# Patient Record
Sex: Female | Born: 1975 | Race: White | Hispanic: No | Marital: Married | Smoking: Former smoker
Health system: Southern US, Community
[De-identification: ages and names within clinical notes are randomized; demographics above are authoritative.]

## PROBLEM LIST (undated history)

## (undated) DIAGNOSIS — K5900 Constipation, unspecified: Secondary | ICD-10-CM

## (undated) DIAGNOSIS — N63 Unspecified lump in unspecified breast: Secondary | ICD-10-CM

## (undated) HISTORY — DX: Constipation, unspecified: K59.00

## (undated) HISTORY — PX: TONSILLECTOMY: SUR1361

## (undated) HISTORY — DX: Unspecified lump in unspecified breast: N63.0

---

## 2004-11-14 ENCOUNTER — Emergency Department: Payer: Self-pay | Admitting: Unknown Physician Specialty

## 2006-12-11 ENCOUNTER — Emergency Department: Payer: Self-pay | Admitting: Emergency Medicine

## 2006-12-11 ENCOUNTER — Other Ambulatory Visit: Payer: Self-pay

## 2009-05-29 ENCOUNTER — Emergency Department: Payer: Self-pay | Admitting: Emergency Medicine

## 2009-10-13 DIAGNOSIS — K5900 Constipation, unspecified: Secondary | ICD-10-CM

## 2009-10-13 HISTORY — DX: Constipation, unspecified: K59.00

## 2009-12-10 ENCOUNTER — Emergency Department: Payer: Self-pay | Admitting: Emergency Medicine

## 2010-01-13 ENCOUNTER — Emergency Department: Payer: Self-pay | Admitting: Emergency Medicine

## 2010-02-19 ENCOUNTER — Emergency Department: Payer: Self-pay | Admitting: Emergency Medicine

## 2010-04-10 ENCOUNTER — Inpatient Hospital Stay (HOSPITAL_COMMUNITY): Admission: AD | Admit: 2010-04-10 | Discharge: 2010-04-10 | Payer: Self-pay | Admitting: Obstetrics and Gynecology

## 2010-04-10 ENCOUNTER — Ambulatory Visit: Payer: Self-pay | Admitting: Nurse Practitioner

## 2010-06-08 ENCOUNTER — Inpatient Hospital Stay: Payer: Self-pay | Admitting: Obstetrics and Gynecology

## 2010-06-14 LAB — PATHOLOGY REPORT

## 2012-09-15 ENCOUNTER — Ambulatory Visit: Payer: Self-pay

## 2013-04-07 ENCOUNTER — Other Ambulatory Visit: Payer: Self-pay

## 2013-04-07 ENCOUNTER — Encounter: Payer: Self-pay | Admitting: General Surgery

## 2013-04-07 ENCOUNTER — Ambulatory Visit (INDEPENDENT_AMBULATORY_CARE_PROVIDER_SITE_OTHER): Payer: PRIVATE HEALTH INSURANCE | Admitting: General Surgery

## 2013-04-07 VITALS — BP 100/62 | HR 74 | Resp 12 | Ht 64.0 in | Wt 127.0 lb

## 2013-04-07 DIAGNOSIS — N63 Unspecified lump in unspecified breast: Secondary | ICD-10-CM | POA: Insufficient documentation

## 2013-04-07 NOTE — Progress Notes (Signed)
Patient ID: Crystal Cohen, female   DOB: 1976-03-19, 37 y.o.   MRN: [id number]  Chief Complaint  Patient presents with  . Breast Problem    HPI Crystal Cohen is a 37 y.o. female.  Patient here today for follow up breast exam.  She does perform self breast exams. She was here Oct 2013 for similar issues including left breast pain and nipple discharge.  It has been occurring on/off since last visit.  States it may occur a couple times a week. Last mammogram Oct 2013. Paternal grandmother with breast cancer in her 42's. The patient reports she has had intermittent nipple breast discharge since her last pregnancy 2 years ago. This usually only occurs with manipulation. If she does not express the fluid a regular basis she reports that the area becomes more tender and blood and pus is expressed with relief of her discomfort. She denies any nipple oral contact. No history of trauma to the area. HPI  Past Medical History  Diagnosis Date  . Constipation 2011  . Lump or mass in breast     left    Past Surgical History  Procedure Laterality Date  . Tonsillectomy  as child  . Cesarean section  2011    Family History  Problem Relation Age of Onset  . Breast cancer Paternal Grandmother     double mastectomy    Social History History  Substance Use Topics  . Smoking status: Current Every Day Smoker -- 0.50 packs/day for 15 years    Types: Cigarettes  . Smokeless tobacco: Never Used  . Alcohol Use: No    No Known Allergies  No current outpatient prescriptions on file.   No current facility-administered medications for this visit.    Review of Systems Review of Systems  Constitutional: Negative.   Respiratory: Negative.   Cardiovascular: Negative.     Blood pressure 100/62, pulse 74, resp. rate 12, height 5\' 4"  (1.626 m), weight 127 lb (57.607 kg), last menstrual period 03/31/2013.  Physical Exam Physical Exam  Constitutional: She is oriented to person, place, and time. She  appears well-developed and well-nourished.  Cardiovascular: Normal rate and regular rhythm.   Pulmonary/Chest: Effort normal and breath sounds normal. Right breast exhibits no inverted nipple, no mass, no nipple discharge, no skin change and no tenderness. Left breast exhibits nipple discharge. Left breast exhibits no inverted nipple, no mass, no skin change and no tenderness. Breasts are symmetrical.  Right breast slight thickening in the axillary tail.    1 drop of drainage from the left nipple clear. Slight thickening at 3 o'clock and upper outer quadrant.   Lymphadenopathy:    She has no cervical adenopathy.    She has no axillary adenopathy.  Neurological: She is alert and oriented to person, place, and time.  Skin: Skin is warm and dry.   Left breast fuller than right breast, unchanged from past exams. Data Reviewed Ultrasound examination of the retroareolar area showed minimal ductal dilatation and only one small cystic area at the 3:00 position of the left breast corresponding the area of focal thickening. This was 6 cm in the nipple and measured 0.35 x 0.74 x 0.81. Acoustic enhancement was identified.  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     Plan    Without the identification of significant ductal dilatation on ultrasound or any significant drainage on vigorous manipulation today, I am reluctant to recommend duct excision. Smoking cessation is her best course to minimize the  chronic inflammatory state. If after 6 months without smoking she is experiencing continued discomfort and drainage, formal excision of the area may be appropriate.        Earline Mayotte 04/07/2013, 9:29 PM

## 2013-04-07 NOTE — Patient Instructions (Addendum)
Patient advised to stop smoking. Patient to return as needed.

## 2014-08-14 ENCOUNTER — Encounter: Payer: Self-pay | Admitting: General Surgery

## 2015-04-11 ENCOUNTER — Other Ambulatory Visit: Payer: Self-pay | Admitting: Obstetrics & Gynecology

## 2015-04-11 ENCOUNTER — Encounter (HOSPITAL_COMMUNITY): Payer: Self-pay | Admitting: *Deleted

## 2015-04-24 NOTE — H&P (Signed)
Crystal Cohen is an 39 y.o. female here for excision of abdominal wall/ c-section scar nodule that is suspected endometrioma or granuloma.  Pt c/o pain at this site with menses since 1 yr after C/section (39 yo kid) and would swell and hurt with menses but now more constant swelling and pain since few months, she was seen for consult and transfer of care at our office and desires to proceed with surgical excision   No LMP recorded.    Past Medical History  Diagnosis Date  . Constipation 2011  . Lump or mass in breast     left    Past Surgical History  Procedure Laterality Date  . Tonsillectomy  as child  . Cesarean section  2011    Family History  Problem Relation Age of Onset  . Breast cancer Paternal Grandmother     double mastectomy    Social History:  reports that she has been smoking Cigarettes.  She has a 7.5 pack-year smoking history. She has never used smokeless tobacco. She reports that she does not drink alcohol or use illicit drugs.  Allergies: No Known Allergies  No prescriptions prior to admission    ROS neg  Physical Exam BP 117/68 mmHg  Pulse 75  Temp(Src) 98.1 F (36.7 C) (Oral)  Resp 18  Ht 5\' 5"  (1.651 m)  Wt 141 lb (63.957 kg)  BMI 23.46 kg/m2  SpO2 100%  A&O x 3, no acute distress. Pleasant HEENT neg, no thyromegaly Lungs CTA bilat CV RRR, S1S2 normal Abdo soft, non tender, non acute. Nodular mass at right end of C/section scar, possible above fascia but will assess at surgery Extr no edema/ tenderness Pelvic normal exam  No results found for this or any previous visit (from the past 24 hour(s)).  No results found.  Assessment/Plan: 39 yo female with C-section scar nodule and pain, here for excision. Reviewed procedure, possible risk of recurrence and need to undergo more surgery, infection/ pain etc.  Risks/complications of surgery reviewed incl infection, bleeding, damage to internal organs including bladder, bowels, ureters, blood  vessels, other risks from anesthesia, VTE and delayed complications of any surgery, complications in future surgery reviewed.   Walt Geathers R 04/24/2015, 5:42 PM

## 2015-04-25 ENCOUNTER — Encounter (HOSPITAL_COMMUNITY): Payer: Self-pay | Admitting: *Deleted

## 2015-04-25 ENCOUNTER — Ambulatory Visit (HOSPITAL_COMMUNITY): Payer: 59 | Admitting: Anesthesiology

## 2015-04-25 ENCOUNTER — Encounter (HOSPITAL_COMMUNITY)
Admission: RE | Disposition: A | Discharge status: Home / Self Care | Payer: Self-pay | Source: Ambulatory Visit | Attending: Obstetrics & Gynecology

## 2015-04-25 ENCOUNTER — Ambulatory Visit (HOSPITAL_COMMUNITY)
Admission: RE | Admit: 2015-04-25 | Discharge: 2015-04-25 | Disposition: A | Discharge status: Home / Self Care | Payer: 59 | Source: Ambulatory Visit | Attending: Obstetrics & Gynecology | Admitting: Obstetrics & Gynecology

## 2015-04-25 DIAGNOSIS — N809 Endometriosis, unspecified: Secondary | ICD-10-CM | POA: Diagnosis not present

## 2015-04-25 DIAGNOSIS — F17210 Nicotine dependence, cigarettes, uncomplicated: Secondary | ICD-10-CM | POA: Diagnosis not present

## 2015-04-25 DIAGNOSIS — L905 Scar conditions and fibrosis of skin: Secondary | ICD-10-CM | POA: Diagnosis present

## 2015-04-25 DIAGNOSIS — N806 Endometriosis in cutaneous scar: Secondary | ICD-10-CM | POA: Diagnosis present

## 2015-04-25 HISTORY — PX: SCAR REVISION: SHX5285

## 2015-04-25 LAB — CBC
HEMATOCRIT: 40.9 % (ref 36.0–46.0)
Hemoglobin: 13.4 g/dL (ref 12.0–15.0)
MCH: 28.5 pg (ref 26.0–34.0)
MCHC: 32.8 g/dL (ref 30.0–36.0)
MCV: 87 fL (ref 78.0–100.0)
Platelets: 221 10*3/uL (ref 150–400)
RBC: 4.7 MIL/uL (ref 3.87–5.11)
RDW: 13.6 % (ref 11.5–15.5)
WBC: 7.6 10*3/uL (ref 4.0–10.5)

## 2015-04-25 LAB — PREGNANCY, URINE: Preg Test, Ur: NEGATIVE

## 2015-04-25 SURGERY — REVISION, SCAR
Anesthesia: Spinal | Site: Abdomen

## 2015-04-25 MED ORDER — CEFAZOLIN SODIUM-DEXTROSE 2-3 GM-% IV SOLR
INTRAVENOUS | Status: AC
  Filled 2015-04-25: qty 50

## 2015-04-25 MED ORDER — SCOPOLAMINE 1 MG/3DAYS TD PT72
MEDICATED_PATCH | TRANSDERMAL | Status: AC
  Administered 2015-04-25: 1.5 mg via TRANSDERMAL
  Filled 2015-04-25: qty 1

## 2015-04-25 MED ORDER — FENTANYL CITRATE (PF) 100 MCG/2ML IJ SOLN
INTRAMUSCULAR | Status: AC
  Filled 2015-04-25: qty 2

## 2015-04-25 MED ORDER — MIDAZOLAM HCL 5 MG/5ML IJ SOLN
INTRAMUSCULAR | Status: DC | PRN
  Administered 2015-04-25: 2 mg via INTRAVENOUS

## 2015-04-25 MED ORDER — CEFAZOLIN SODIUM-DEXTROSE 2-3 GM-% IV SOLR
2.0000 g | INTRAVENOUS | Status: AC
  Administered 2015-04-25: 2 g via INTRAVENOUS

## 2015-04-25 MED ORDER — PROPOFOL 500 MG/50ML IV EMUL
INTRAVENOUS | Status: AC
  Filled 2015-04-25: qty 50

## 2015-04-25 MED ORDER — DEXAMETHASONE SODIUM PHOSPHATE 4 MG/ML IJ SOLN
INTRAMUSCULAR | Status: AC
  Filled 2015-04-25: qty 1

## 2015-04-25 MED ORDER — MEPERIDINE HCL 25 MG/ML IJ SOLN: 6.2500 mg | INTRAMUSCULAR | Status: DC | PRN

## 2015-04-25 MED ORDER — BUPIVACAINE IN DEXTROSE 0.75-8.25 % IT SOLN
INTRATHECAL | Status: DC | PRN
  Administered 2015-04-25: 1.5 mL via INTRATHECAL

## 2015-04-25 MED ORDER — KETOROLAC TROMETHAMINE 30 MG/ML IJ SOLN
INTRAMUSCULAR | Status: DC | PRN
  Administered 2015-04-25: 30 mg via INTRAVENOUS

## 2015-04-25 MED ORDER — ONDANSETRON HCL 4 MG/2ML IJ SOLN
INTRAMUSCULAR | Status: DC | PRN
  Administered 2015-04-25: 4 mg via INTRAVENOUS

## 2015-04-25 MED ORDER — LACTATED RINGERS IV SOLN
INTRAVENOUS | Status: DC
  Administered 2015-04-25: 09:00:00 via INTRAVENOUS

## 2015-04-25 MED ORDER — FENTANYL CITRATE (PF) 100 MCG/2ML IJ SOLN
INTRAMUSCULAR | Status: DC | PRN
  Administered 2015-04-25 (×2): 50 ug via INTRAVENOUS

## 2015-04-25 MED ORDER — PROPOFOL INFUSION 10 MG/ML OPTIME
INTRAVENOUS | Status: DC | PRN
  Administered 2015-04-25: 50 ug/kg/min via INTRAVENOUS

## 2015-04-25 MED ORDER — METOCLOPRAMIDE HCL 5 MG/ML IJ SOLN: 10.0000 mg | Freq: Once | INTRAMUSCULAR | Status: DC | PRN

## 2015-04-25 MED ORDER — KETOROLAC TROMETHAMINE 30 MG/ML IJ SOLN
INTRAMUSCULAR | Status: AC
  Filled 2015-04-25: qty 1

## 2015-04-25 MED ORDER — LIDOCAINE HCL 2 % IJ SOLN
INTRAMUSCULAR | Status: AC
  Filled 2015-04-25: qty 20

## 2015-04-25 MED ORDER — LIDOCAINE HCL 2 % IJ SOLN
INTRAMUSCULAR | Status: DC | PRN
  Administered 2015-04-25: 5 mL

## 2015-04-25 MED ORDER — HYDROCODONE-ACETAMINOPHEN 7.5-325 MG PO TABS: 1.0000 | ORAL_TABLET | Freq: Once | ORAL | Status: DC | PRN

## 2015-04-25 MED ORDER — MIDAZOLAM HCL 2 MG/2ML IJ SOLN
INTRAMUSCULAR | Status: AC
  Filled 2015-04-25: qty 2

## 2015-04-25 MED ORDER — FENTANYL CITRATE (PF) 100 MCG/2ML IJ SOLN: 25.0000 ug | INTRAMUSCULAR | Status: DC | PRN

## 2015-04-25 MED ORDER — IBUPROFEN 600 MG PO TABS: 600.0000 mg | ORAL_TABLET | Freq: Three times a day (TID) | ORAL | Status: DC | PRN

## 2015-04-25 MED ORDER — ONDANSETRON HCL 4 MG/2ML IJ SOLN
INTRAMUSCULAR | Status: AC
  Filled 2015-04-25: qty 2

## 2015-04-25 MED ORDER — OXYCODONE-ACETAMINOPHEN 5-325 MG PO TABS
1.0000 | ORAL_TABLET | ORAL | Status: DC | PRN
Start: 2015-04-25 — End: 2018-06-07

## 2015-04-25 MED ORDER — DEXAMETHASONE SODIUM PHOSPHATE 10 MG/ML IJ SOLN
INTRAMUSCULAR | Status: DC | PRN
  Administered 2015-04-25: 4 mg via INTRAVENOUS

## 2015-04-25 MED ORDER — SCOPOLAMINE 1 MG/3DAYS TD PT72
1.0000 | MEDICATED_PATCH | Freq: Once | TRANSDERMAL | Status: DC
  Administered 2015-04-25: 1.5 mg via TRANSDERMAL

## 2015-04-25 SURGICAL SUPPLY — 30 items
BENZOIN TINCTURE PRP APPL 2/3 (GAUZE/BANDAGES/DRESSINGS) ×4 IMPLANT
BLADE SURG 15 STRL LF C SS BP (BLADE) ×2 IMPLANT
BLADE SURG 15 STRL SS (BLADE) ×2
CATH ROBINSON RED A/P 14FR (CATHETERS) ×4 IMPLANT
CHLORAPREP W/TINT 26ML (MISCELLANEOUS) ×4 IMPLANT
CLEANER TIP ELECTROSURG 2X2 (MISCELLANEOUS) ×4 IMPLANT
CLOSURE WOUND 1/4 X3 (GAUZE/BANDAGES/DRESSINGS) ×1
CLOTH BEACON ORANGE TIMEOUT ST (SAFETY) ×4 IMPLANT
COUNTER NEEDLE 1200 MAGNETIC (NEEDLE) IMPLANT
DRSG OPSITE POSTOP 3X4 (GAUZE/BANDAGES/DRESSINGS) ×4 IMPLANT
ELECT REM PT RETURN 9FT ADLT (ELECTROSURGICAL) ×4
ELECTRODE REM PT RTRN 9FT ADLT (ELECTROSURGICAL) ×2 IMPLANT
GLOVE BIO SURGEON STRL SZ7 (GLOVE) ×4 IMPLANT
GLOVE INDICATOR 7.0 STRL GRN (GLOVE) ×4 IMPLANT
GOWN STRL REUS W/TWL LRG LVL3 (GOWN DISPOSABLE) ×8 IMPLANT
NEEDLE HYPO 22GX1.5 SAFETY (NEEDLE) ×4 IMPLANT
PACK ABDOMINAL MINOR (CUSTOM PROCEDURE TRAY) ×4 IMPLANT
PAD OB MATERNITY 4.3X12.25 (PERSONAL CARE ITEMS) ×4 IMPLANT
PENCIL BUTTON HOLSTER BLD 10FT (ELECTRODE) IMPLANT
STRIP CLOSURE SKIN 1/4X3 (GAUZE/BANDAGES/DRESSINGS) ×3 IMPLANT
SUT PLAIN 2 0 (SUTURE) ×2
SUT PLAIN ABS 2-0 CT1 27XMFL (SUTURE) ×2 IMPLANT
SUT VIC AB 3-0 SH 27 (SUTURE)
SUT VIC AB 3-0 SH 27X BRD (SUTURE) IMPLANT
SUT VIC AB 4-0 KS 27 (SUTURE) ×4 IMPLANT
SYR CONTROL 10ML LL (SYRINGE) ×4 IMPLANT
TOWEL OR 17X24 6PK STRL BLUE (TOWEL DISPOSABLE) ×8 IMPLANT
TUBING SUCTION BULK 100 FT (MISCELLANEOUS) ×4 IMPLANT
WATER STERILE IRR 1000ML POUR (IV SOLUTION) ×4 IMPLANT
YANKAUER SUCT BULB TIP NO VENT (SUCTIONS) ×4 IMPLANT

## 2015-04-25 NOTE — Discharge Instructions (Signed)
No [employer] productions until 5 pm

## 2015-04-25 NOTE — Anesthesia Postprocedure Evaluation (Signed)
  Anesthesia Post-op Note  Patient: Crystal Cohen  Procedure(s) Performed: Procedure(s) with comments: Cesarean Section SCAR Mass/Nodule Excision (N/A) - 1 hr.  Patient Location: PACU  Anesthesia Type:Spinal  Level of Consciousness: awake, alert  and oriented  Airway and Oxygen Therapy: Patient Spontanous Breathing  Post-op Pain: none  Post-op Assessment: Post-op Vital signs reviewed, Patient's Cardiovascular Status Stable, Respiratory Function Stable, Patent Airway, No signs of Nausea or vomiting, Pain level controlled, No headache, No backache and Spinal receding LLE Motor Response: Purposeful movement LLE Sensation: Increased RLE Motor Response: Purposeful movement RLE Sensation: Increased      Post-op Vital Signs: Reviewed and stable  Last Vitals:  Filed Vitals:   04/25/15 1230  BP: 102/53  Pulse: 59  Temp:   Resp: 17    Complications: No apparent anesthesia complications

## 2015-04-25 NOTE — Transfer of Care (Signed)
Immediate Anesthesia Transfer of Care Note  Patient: Crystal Cohen  Procedure(s) Performed: Procedure(s) with comments: Cesarean Section SCAR Mass/Nodule Excision (N/A) - 1 hr.  Patient Location: PACU  Anesthesia Type:Spinal  Level of Consciousness: awake, alert  and oriented  Airway & Oxygen Therapy: Patient Spontanous Breathing and Patient connected to nasal cannula oxygen  Post-op Assessment: Report given to RN and Post -op Vital signs reviewed and stable  Post vital signs: Reviewed and stable  Last Vitals:  Filed Vitals:   04/25/15 0830  BP: 117/68  Pulse: 75  Temp: 36.7 C  Resp: 18    Complications: No apparent anesthesia complications

## 2015-04-25 NOTE — Anesthesia Procedure Notes (Addendum)
Spinal Patient location during procedure: OR Start time: 04/25/2015 10:05 AM Staffing Anesthesiologist: Mal AmabileFOSTER, Deserie Dirks Performed by: anesthesiologist  Preanesthetic Checklist Completed: patient identified, site marked, surgical consent, pre-op evaluation, timeout performed, IV checked, risks and benefits discussed and monitors and equipment checked Spinal Block Patient position: sitting Prep: site prepped and draped and DuraPrep Patient monitoring: heart rate, cardiac monitor, continuous pulse ox and blood pressure Approach: midline Location: L3-4 Injection technique: single-shot Needle Needle type: Sprotte  Needle gauge: 24 G Needle length: 9 cm Needle insertion depth: 5 cm Assessment Sensory level: T6 Additional Notes Patient tolerated procedure well. Adequate sensory level.

## 2015-04-25 NOTE — Anesthesia Preprocedure Evaluation (Signed)
Anesthesia Evaluation  Patient identified by MRN, date of birth, ID band Patient awake    Reviewed: Allergy & Precautions, NPO status , Patient's Chart, lab work & pertinent test results  Airway Mallampati: I  TM Distance: >3 FB Neck ROM: Full    Dental no notable dental hx.    Pulmonary Current Smoker,  breath sounds clear to auscultation  Pulmonary exam normal       Cardiovascular negative cardio ROS Normal cardiovascular examRhythm:Regular Rate:Normal     Neuro/Psych negative neurological ROS  negative psych ROS   GI/Hepatic negative GI ROS, Neg liver ROS,   Endo/Other  negative endocrine ROS  Renal/GU negative Renal ROS  negative genitourinary   Musculoskeletal C/Section scar mass/nodule   Abdominal   Peds  Hematology negative hematology ROS (+)   Anesthesia Other Findings   Reproductive/Obstetrics negative OB ROS                             Anesthesia Physical Anesthesia Plan  ASA: II  Anesthesia Plan: Spinal   Post-op Pain Management:    Induction: Intravenous  Airway Management Planned: Natural Airway  Additional Equipment:   Intra-op Plan:   Post-operative Plan:   Informed Consent: I have reviewed the patients History and Physical, chart, labs and discussed the procedure including the risks, benefits and alternatives for the proposed anesthesia with the patient or authorized representative who has indicated his/her understanding and acceptance.     Plan Discussed with: CRNA, Anesthesiologist and Surgeon  Anesthesia Plan Comments:         Anesthesia Quick Evaluation

## 2015-04-25 NOTE — Op Note (Signed)
PRE-OPERATIVE DIAGNOSIS:  Abdominal wall mass at c-section scar  POST-OPERATIVE DIAGNOSIS:  Same   PROCEDURE:  Exploration of c-section scar and excision of scar nodule   SURGEON: Robley FriesVaishali R Bashar Milam, MD  ASSISTANT:  Crystal Cohen, CNM  ANESTHESIA:  Spinal   EBL:  20 cc  IVF: LR   Urine output: urine in foley  LOCAL MEDICATIONS USED:  MARCAINE 0.25% 10 cc skin infiltration     SPECIMEN:  Abdominal wall scar nodule 3 cm size  DISPOSITION OF SPECIMEN:  PATHOLOGY  COUNTS:  YES  PATIENT DISPOSITION:  PACU - hemodynamically stable   PROCEDURE:   Indication: Abdominal wall mass in c-section scar with pain. Patient notes this mass since over 2 years but is more prominent, painful and desires intervention. Excision reviewed, patient gave informed written consent.   Patient was brought to the Operating Room and time out was done, confirmed patient as Crystal Cohen and procedure as Exploration of scar and excision of scar nodule. 2 gm Ancef given. She underwent Spinal anesthesia which was noted to be excellent. Foley was placed. Abdomen was prepped and draped. Mass was delineated with a marker. Transverse incision made about 4-5 cm from the right end of her c-section scar. Incision was carried down about 3-4 mm where the hard mass was palpated. Skin edges were grasped with Allis clamps and mass with Allis and Towel clamp and placed under tension while sharp dissection was performed to excise it from the surrounding soft fatty tissue. Base was noted above the fascia. Mass was excised entirely and measured 3 cm and sent to pathology. Bleeding was minimal and controlled with cautery. Area was palpated and no hard/fibrous area were palpated in the space from where mass was excised. Subcutaneous layer was closed with 2-0 Plain gut to cover the dead space. Skin approximated with 4-0 Vicryl in subcuticular fashion.  Sterile dressing placed.  Sterile Honeycomb dressing placed.  All  Instruments/ lap/  sponges counts were correct x2. No complications. Mass shown to patient and picture to her husband.   Dr Crystal Cohen was the surgeon for entire case.

## 2015-04-26 ENCOUNTER — Encounter (HOSPITAL_COMMUNITY): Payer: Self-pay | Admitting: Obstetrics & Gynecology

## 2016-04-29 ENCOUNTER — Other Ambulatory Visit: Payer: Self-pay | Admitting: Obstetrics & Gynecology

## 2016-08-11 ENCOUNTER — Encounter (INDEPENDENT_AMBULATORY_CARE_PROVIDER_SITE_OTHER): Payer: Self-pay

## 2016-08-22 ENCOUNTER — Ambulatory Visit: Payer: Self-pay | Admitting: Family Medicine

## 2018-02-05 ENCOUNTER — Encounter: Payer: Self-pay | Admitting: Certified Nurse Midwife

## 2018-02-12 ENCOUNTER — Encounter: Payer: Self-pay | Admitting: Certified Nurse Midwife

## 2018-03-19 ENCOUNTER — Encounter: Payer: Self-pay | Admitting: Certified Nurse Midwife

## 2018-06-07 ENCOUNTER — Encounter: Payer: Self-pay | Admitting: Certified Nurse Midwife

## 2018-06-07 ENCOUNTER — Ambulatory Visit (INDEPENDENT_AMBULATORY_CARE_PROVIDER_SITE_OTHER): Payer: BLUE CROSS/BLUE SHIELD | Admitting: Certified Nurse Midwife

## 2018-06-07 ENCOUNTER — Other Ambulatory Visit (HOSPITAL_COMMUNITY)
Admission: RE | Admit: 2018-06-07 | Discharge: 2018-06-07 | Disposition: A | Discharge status: Home / Self Care | Payer: BLUE CROSS/BLUE SHIELD | Source: Ambulatory Visit | Attending: Certified Nurse Midwife | Admitting: Certified Nurse Midwife

## 2018-06-07 VITALS — BP 118/76 | HR 93 | Ht 64.0 in | Wt 150.3 lb

## 2018-06-07 DIAGNOSIS — Z124 Encounter for screening for malignant neoplasm of cervix: Secondary | ICD-10-CM | POA: Diagnosis present

## 2018-06-07 DIAGNOSIS — Z1231 Encounter for screening mammogram for malignant neoplasm of breast: Secondary | ICD-10-CM

## 2018-06-07 DIAGNOSIS — Z1151 Encounter for screening for human papillomavirus (HPV): Secondary | ICD-10-CM | POA: Insufficient documentation

## 2018-06-07 DIAGNOSIS — Z0000 Encounter for general adult medical examination without abnormal findings: Secondary | ICD-10-CM

## 2018-06-07 DIAGNOSIS — Z1239 Encounter for other screening for malignant neoplasm of breast: Secondary | ICD-10-CM

## 2018-06-07 DIAGNOSIS — Z01419 Encounter for gynecological examination (general) (routine) without abnormal findings: Secondary | ICD-10-CM | POA: Diagnosis not present

## 2018-06-07 MED ORDER — VARENICLINE TARTRATE 0.5 MG PO TABS: 0.5000 mg | ORAL_TABLET | Freq: Two times a day (BID) | ORAL | 3 refills | Status: DC

## 2018-06-07 NOTE — Progress Notes (Signed)
Pt is here for an annual exam. Poss yeast infection. Has been on amoxicillin.

## 2018-06-07 NOTE — Progress Notes (Signed)
GYNECOLOGY ANNUAL PREVENTATIVE CARE ENCOUNTER NOTE  Subjective:   Crystal Cohen is a 42 y.o. 373P0 female here for a routine annual gynecologic exam.  Current complaints: none.   Denies abnormal vaginal bleeding, discharge, pelvic pain, problems with intercourse or other gynecologic concerns.    Gynecologic History Patient's last menstrual period was 05/17/2018 (exact date). Contraception: tubal ligation Last Pap: a few years ago. Results were: normal per pt Last mammogram: a few years ago  Results were: normal  Obstetric History OB History  Gravida Para Term Preterm AB Living  3         4  SAB TAB Ectopic Multiple Live Births        1      # Outcome Date GA Lbr Len/2nd Weight Sex Delivery Anes PTL Lv  3 Gravida           2 Gravida           1 Gravida             Obstetric Comments  Menstrual age: 89    Age 1st Pregnancy:16    Past Medical History:  Diagnosis Date  . Constipation 2011  . Lump or mass in breast    left  . Vaginal delivery 1993, 2002    Past Surgical History:  Procedure Laterality Date  . CESAREAN SECTION  2011  . SCAR REVISION N/A 04/25/2015   Procedure: Cesarean Section SCAR Mass/Nodule Excision;  Surgeon: Shea EvansVaishali Mody, MD;  Location: [facility];  Service: Gynecology;  Laterality: N/A;  1 hr.  . TONSILLECTOMY  as child    Current Outpatient Medications on File Prior to Visit  Medication Sig Dispense Refill  . amoxicillin (AMOXIL) 500 MG capsule TAKE 1 CAPSULE BY MOUTH THREE TIMES A DAY UNTIL GONE  0  . ibuprofen (ADVIL,MOTRIN) 600 MG tablet Take 1 tablet (600 mg total) by mouth every 8 (eight) hours as needed for mild pain or moderate pain. 20 tablet 0   No current facility-administered medications on file prior to visit.     No Known Allergies  Social History:  reports that she has been smoking cigarettes. She has a 7.50 pack-year smoking history. She has never used smokeless tobacco. She reports that she does not drink alcohol or use  drugs.  Family History  Problem Relation Age of Onset  . Breast cancer Paternal Grandmother        double mastectomy    The following portions of the patient's history were reviewed and updated as appropriate: allergies, current medications, past family history, past medical history, past social history, past surgical history and problem list.  Review of Systems Pertinent items noted in HPI and remainder of comprehensive ROS otherwise negative.   Objective:  BP 118/76   Pulse 93   Ht 5\' 4"  (1.626 m)   Wt 150 lb 5 oz (68.2 kg)   LMP 05/17/2018 (Exact Date)   BMI 25.80 kg/m  CONSTITUTIONAL: Well-developed, well-nourished female in no acute distress.  HENT:  Normocephalic, atraumatic, External right and left ear normal. Oropharynx is clear and moist EYES: Conjunctivae and EOM are normal. Pupils are equal, round, and reactive to light. No scleral icterus.  NECK: Normal range of motion, supple, no masses.  Normal thyroid.  SKIN: Skin is warm and dry. No rash noted. Not diaphoretic. No erythema. No pallor. MUSCULOSKELETAL: Normal range of motion. No tenderness.  No cyanosis, clubbing, or edema.  2+ distal pulses. NEUROLOGIC: Alert and oriented to person, place, and time.  Normal reflexes, muscle tone coordination. No cranial nerve deficit noted. PSYCHIATRIC: Normal mood and affect. Normal behavior. Normal judgment and thought content. CARDIOVASCULAR: Normal heart rate noted, regular rhythm RESPIRATORY: Clear to auscultation bilaterally. Effort and breath sounds normal, no problems with respiration noted. BREASTS: Symmetric in size. No masses, skin changes, nipple drainage, or lymphadenopathy. ABDOMEN: Soft, normal bowel sounds, no distention noted.  No tenderness, rebound or guarding.  PELVIC: Normal appearing external genitalia; normal appearing vaginal mucosa and cervix.  No abnormal discharge noted.  Pap smear obtained.  Normal uterine size, no other palpable masses, no uterine or  adnexal tenderness.    Assessment and Plan:  Annual Physical exam. Discussed smoking cessation. She states that she has tried wellbutrin in the past and it gave her terrible night mares. She request to try Chantix.  Will follow up results of pap smear and manage accordingly. Mammogram scheduled Routine preventative health maintenance measures emphasized. Please refer to After Visit Summary for other counseling recommendations.   Doreene Burke, CNM

## 2018-06-07 NOTE — Patient Instructions (Signed)
Preventing High Cholesterol Cholesterol is a waxy, fat-like substance that your body needs in small amounts. Your liver makes all the cholesterol that your body needs. Having high cholesterol (hypercholesterolemia) increases your risk for heart disease and stroke. Extra (excess) cholesterol comes from the food you eat, such as animal-based fat (saturated fat) from meat and some dairy products. High cholesterol can often be prevented with diet and lifestyle changes. If you already have high cholesterol, you can control it with diet and lifestyle changes, as well as medicine. What nutrition changes can be made?  Eat less saturated fat. Foods that contain saturated fat include red meat and some dairy products.  Avoid processed meats, like bacon and lunch meats.  Avoid trans fats, which are found in margarine and some baked goods.  Avoid foods and beverages that have added sugars.  Eat more fruits, vegetables, and whole grains.  Choose healthy sources of protein, such as fish, poultry, and nuts.  Choose healthy sources of fat, such as: ? Nuts. ? Vegetable oils, especially olive oil. ? Fish that have healthy fats (omega-3 fatty acids), such as mackerel or salmon. What lifestyle changes can be made?  Lose weight if you are overweight. Losing 5-10 lb (2.3-4.5 kg) can help prevent or control high cholesterol and reduce your risk for diabetes and high blood pressure. Ask your health care provider to help you with a diet and exercise plan to safely lose weight.  Get enough exercise. Do at least 150 minutes of moderate-intensity exercise each week. ? You could do this in short exercise sessions several times a day, or you could do longer exercise sessions a few times a week. For example, you could take a brisk 10-minute walk or bike ride, 3 times a day, for 5 days a week.  Do not smoke. If you need help quitting, ask your health care provider.  Limit your alcohol intake. If you drink alcohol,  limit alcohol intake to no more than 1 drink a day for nonpregnant women and 2 drinks a day for men. One drink equals 12 oz of beer, 5 oz of wine, or 1 oz of hard liquor. Why are these changes important? If you have high cholesterol, deposits (plaques) may build up on the walls of your blood vessels. Plaques make the arteries narrower and stiffer, which can restrict or block blood flow and cause blood clots to form. This greatly increases your risk for heart attack and stroke. Making diet and lifestyle changes can reduce your risk for these life-threatening conditions. What can I do to lower my risk?  Manage your risk factors for high cholesterol. Talk with your health care provider about all of your risk factors and how to lower your risk.  Manage other conditions that you have, such as diabetes or high blood pressure (hypertension).  Have your cholesterol checked at regular intervals.  Keep all follow-up visits as told by your health care provider. This is important. How is this treated? In addition to diet and lifestyle changes, your health care provider may recommend medicines to help lower cholesterol, such as a medicine to reduce the amount of cholesterol made in your liver. You may need medicine if:  Diet and lifestyle changes do not lower your cholesterol enough.  You have high cholesterol and other risk factors for heart disease or stroke.  Take over-the-counter and prescription medicines only as told by your health care provider. Where to find more information:  American Heart Association: www.heart.org/HEARTORG/Conditions/Cholesterol/Cholesterol_UCM_001089_SubHomePage.jsp  National Heart, Lung, and   Blood Institute: www.nhlbi.nih.gov/health/resources/heart/heart-cholesterol-hbc-what-html Summary  High cholesterol increases your risk for heart disease and stroke. By keeping your cholesterol level low, you can reduce your risk for these conditions.  Diet and lifestyle changes  are the most important steps in preventing high cholesterol.  Work with your health care provider to manage your risk factors, and have your blood tested regularly. This information is not intended to replace advice given to you by your health care provider. Make sure you discuss any questions you have with your health care provider. Document Released: 10/14/2015 Document Revised: 06/07/2016 Document Reviewed: 06/07/2016 Elsevier Interactive Patient Education  2018 [employer]  

## 2018-06-08 ENCOUNTER — Telehealth: Payer: Self-pay

## 2018-06-08 ENCOUNTER — Other Ambulatory Visit: Payer: Self-pay

## 2018-06-08 ENCOUNTER — Telehealth: Payer: Self-pay | Admitting: Certified Nurse Midwife

## 2018-06-08 LAB — LIPID PANEL
CHOLESTEROL TOTAL: 220 mg/dL — AB (ref 100–199)
Chol/HDL Ratio: 5.1 ratio — ABNORMAL HIGH (ref 0.0–4.4)
HDL: 43 mg/dL (ref >39)
LDL Calculated: 151 mg/dL — ABNORMAL HIGH (ref 0–99)
TRIGLYCERIDES: 129 mg/dL (ref 0–149)
VLDL CHOLESTEROL CAL: 26 mg/dL (ref 5–40)

## 2018-06-08 MED ORDER — FLUCONAZOLE 150 MG PO TABS: 150.0000 mg | ORAL_TABLET | Freq: Once | ORAL | 1 refills | Status: AC

## 2018-06-08 NOTE — Telephone Encounter (Signed)
The patient called and stated that one of her medications did not get sent in to her pharmacy. The patient stated that she was informed by Pattricia BossAnnie that "two medications" would be sent in for her Yeast infection. The patient would like a call back to confirm the medication has been sent to her pharmacy. Please advise.

## 2018-06-08 NOTE — Telephone Encounter (Signed)
Diflucan sent to pharmacy with confirmation of receipt.

## 2018-06-09 LAB — CYTOLOGY - PAP
Diagnosis: NEGATIVE
HPV (WINDOPATH): NOT DETECTED

## 2018-11-23 ENCOUNTER — Emergency Department: Payer: BLUE CROSS/BLUE SHIELD

## 2018-11-23 ENCOUNTER — Other Ambulatory Visit: Payer: Self-pay

## 2018-11-23 ENCOUNTER — Emergency Department
Admission: EM | Admit: 2018-11-23 | Discharge: 2018-11-23 | Disposition: A | Discharge status: Home / Self Care | Payer: BLUE CROSS/BLUE SHIELD | Attending: Emergency Medicine | Admitting: Emergency Medicine

## 2018-11-23 ENCOUNTER — Encounter: Payer: Self-pay | Admitting: *Deleted

## 2018-11-23 DIAGNOSIS — M79605 Pain in left leg: Secondary | ICD-10-CM | POA: Diagnosis not present

## 2018-11-23 DIAGNOSIS — R42 Dizziness and giddiness: Secondary | ICD-10-CM | POA: Diagnosis not present

## 2018-11-23 DIAGNOSIS — F17210 Nicotine dependence, cigarettes, uncomplicated: Secondary | ICD-10-CM | POA: Insufficient documentation

## 2018-11-23 DIAGNOSIS — Z79899 Other long term (current) drug therapy: Secondary | ICD-10-CM | POA: Diagnosis not present

## 2018-11-23 DIAGNOSIS — R0789 Other chest pain: Secondary | ICD-10-CM | POA: Diagnosis not present

## 2018-11-23 DIAGNOSIS — R0602 Shortness of breath: Secondary | ICD-10-CM | POA: Diagnosis not present

## 2018-11-23 LAB — BASIC METABOLIC PANEL
ANION GAP: 10 (ref 5–15)
BUN: 10 mg/dL (ref 6–20)
CALCIUM: 9 mg/dL (ref 8.9–10.3)
CO2: 25 mmol/L (ref 22–32)
Chloride: 104 mmol/L (ref 98–111)
Creatinine, Ser: 0.73 mg/dL (ref 0.44–1.00)
GFR calc non Af Amer: >60 mL/min (ref >60)
Glucose, Bld: 95 mg/dL (ref 70–99)
Potassium: 3.5 mmol/L (ref 3.5–5.1)
Sodium: 139 mmol/L (ref 135–145)

## 2018-11-23 LAB — CBC
HCT: 45 % (ref 36.0–46.0)
Hemoglobin: 14.2 g/dL (ref 12.0–15.0)
MCH: 27.5 pg (ref 26.0–34.0)
MCHC: 31.6 g/dL (ref 30.0–36.0)
MCV: 87.2 fL (ref 80.0–100.0)
Platelets: 290 10*3/uL (ref 150–400)
RBC: 5.16 MIL/uL — AB (ref 3.87–5.11)
RDW: 13.5 % (ref 11.5–15.5)
WBC: 10.7 10*3/uL — ABNORMAL HIGH (ref 4.0–10.5)
nRBC: 0 % (ref 0.0–0.2)

## 2018-11-23 LAB — TROPONIN I
Troponin I: <0.03 ng/mL (ref <0.03)
Troponin I: <0.03 ng/mL (ref <0.03)

## 2018-11-23 LAB — FIBRIN DERIVATIVES D-DIMER (ARMC ONLY): FIBRIN DERIVATIVES D-DIMER (ARMC): 522.96 ng{FEU}/mL — AB (ref 0.00–499.00)

## 2018-11-23 LAB — POCT PREGNANCY, URINE: PREG TEST UR: NEGATIVE

## 2018-11-23 MED ORDER — IOHEXOL 350 MG/ML SOLN
75.0000 mL | Freq: Once | INTRAVENOUS | Status: AC | PRN
  Administered 2018-11-23: 75 mL via INTRAVENOUS

## 2018-11-23 NOTE — ED Provider Notes (Signed)
[facility] Emergency Department Provider Note ____________________________________________   First MD Initiated Contact with Patient 11/23/18 2101     (approximate)  I have reviewed the triage vital signs and the nursing notes.   HISTORY  Chief Complaint Chest Pain    HPI Crystal Cohen is a 43 y.o. female with PMH as noted below who presents with chest pain, acute onset around 6 PM today when the patient was sitting and not exerting herself.  It is described as sharp and substernal with with some radiation to the left side.  The patient states that after the pain started she began to have symptoms that she states are similar to prior panic attacks, with shortness of breath and lightheadedness.  The symptoms have now improved.  She does not have any prior history of this chest pain, and has no prior cardiac history.  The patient also reports pain in her left leg radiating from the back, described as shooting, and occurring intermittently over the last 2 days.  She has no swelling to the leg.  The patient states that she is not on OCPs and has no cancer history.  She has had no recent immobilization.  She does smoke.  The patient does report recent increased stress as well.   Past Medical History:  Diagnosis Date  . Constipation 2011  . Lump or mass in breast    left  . Vaginal delivery 1993, 2002    Patient Active Problem List   Diagnosis Date Noted  . 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 Lump or mass in breast 04/07/2013    Past Surgical History:  Procedure Laterality Date  . CESAREAN SECTION  2011  . SCAR REVISION N/A 04/25/2015   Procedure: Cesarean Section SCAR Mass/Nodule Excision;  Surgeon: Shea EvansVaishali Mody, MD;  Location: [facility];  Service: Gynecology;  Laterality: N/A;  1 hr.  . TONSILLECTOMY  as child    Prior to Admission medications   Medication Sig Start Date End Date Taking? Authorizing Provider  amoxicillin (AMOXIL) 500 MG  capsule TAKE 1 CAPSULE BY MOUTH THREE TIMES A DAY UNTIL GONE 06/01/18   [provider]  ibuprofen (ADVIL,MOTRIN) 600 MG tablet Take 1 tablet (600 mg total) by mouth every 8 (eight) hours as needed for mild pain or moderate pain. 04/25/15   Shea EvansMody, Vaishali, MD  varenicline (CHANTIX) 0.5 MG tablet Take 1 tablet (0.5 mg total) by mouth 2 (two) times daily. Days 1 to 3: 0.5 mg once daily.Days 4 to 7: 0.5 mg twice daily 06/07/18   Doreene Burkehompson, Annie, CNM    Allergies Patient has no known allergies.  Family History  Problem Relation Age of Onset  . Breast cancer Paternal Grandmother        double mastectomy    Social History Social History   Tobacco Use  . Smoking status: Current Every Day Smoker    Packs/day: 0.50    Years: 15.00    Pack years: 7.50    Types: Cigarettes  . Smokeless tobacco: Never Used  Substance Use Topics  . Alcohol use: No  . Drug use: No    Review of Systems  Constitutional: No fever. Eyes: No redness. ENT: No neck pain. Cardiovascular: Positive for chest pain. Respiratory: Positive for shortness of breath. Gastrointestinal: No vomiting or diarrhea.  Genitourinary: Negative for flank pain.  Musculoskeletal: Positive for back pain. Skin: Negative for rash. Neurological: Negative for headaches, focal weakness or numbness.   ____________________________________________   PHYSICAL EXAM:  VITAL SIGNS: ED Triage Vitals [11/23/18 1945]  Enc Vitals Group     BP (!) 142/80     Pulse Rate 94     Resp 16     Temp 98.2 F (36.8 C)     Temp Source Oral     SpO2 99 %     Weight 160 lb (72.6 kg)     Height 5\' 4"  (1.626 m)     Head Circumference      Peak Flow      Pain Score 7     Pain Loc      Pain Edu?      Excl. in [place]?     Constitutional: Alert and oriented. Well appearing and in no acute distress. Eyes: Conjunctivae are normal.  Head: Atraumatic. Nose: No congestion/rhinnorhea. Mouth/Throat: Mucous membranes are moist.   Neck: Normal  range of motion.  Cardiovascular: Normal rate, regular rhythm. Grossly normal heart sounds.  Good peripheral circulation. Respiratory: Normal respiratory effort.  No retractions. Lungs CTAB. Gastrointestinal: No distention.  Musculoskeletal: No lower extremity edema.  Extremities warm and well perfused.  No calf or popliteal tenderness or swelling. Neurologic:  Normal speech and language. No gross focal neurologic deficits are appreciated.  Skin:  Skin is warm and dry. No rash noted. Psychiatric: Mood and affect are normal. Speech and behavior are normal.  ____________________________________________   LABS (all labs ordered are listed, but only abnormal results are displayed)  Labs Reviewed  CBC - Abnormal; Notable for the following components:      Result Value   WBC 10.7 (*)    RBC 5.16 (*)    All other components within normal limits  FIBRIN DERIVATIVES D-DIMER ([facility] ONLY) - Abnormal; Notable for the following components:   Fibrin derivatives D-dimer ([facility]) 522.96 (*)    All other components within normal limits  BASIC METABOLIC PANEL  TROPONIN I  TROPONIN I  POC URINE PREG, ED  POCT PREGNANCY, URINE   ____________________________________________  EKG  ED ECG REPORT I, Dionne BucySebastian Ananth Fiallos, the attending physician, personally viewed and interpreted this ECG.  Date: 11/23/2018 EKG Time: 1946 Rate: 91 Rhythm: normal sinus rhythm QRS Axis: normal Intervals: normal ST/T Wave abnormalities: normal Narrative Interpretation: no evidence of acute ischemia  ____________________________________________  RADIOLOGY  CXR: No focal infiltrate or other acute abnormality  ____________________________________________   PROCEDURES  Procedure(s) performed: No  Procedures  Critical Care performed: No ____________________________________________   INITIAL IMPRESSION / ASSESSMENT AND PLAN / ED COURSE  Pertinent labs & imaging results that were available during my care of  the patient were reviewed by me and considered in my medical decision making (see chart for details).  43 year old female with PMH as noted above presents with atypical and nonexertional chest pain, with symptoms of acute anxiety/panic following the initial pain.  The patient also reports some intermittent shooting pains in her left leg over the last few days, especially at night.  I reviewed the past medical records in Epic.  The patient has had no recent prior ED visits or admissions.  She has no prior cardiac history.  On exam the patient is overall well-appearing and her vital signs are normal.  The physical exam is unremarkable.  She has no lower leg swelling or any tenderness.  Her EKG is also normal.  Overall presentation is consistent with benign etiology such as anxiety/panic, musculoskeletal pain, or GERD.  The patient has no risk factors for ACS other than smoking.  I also have a very  low suspicion for PE as her lower extremity symptoms are not really consistent with a DVT and other than smoking she has no VTE risk factors.  There is no evidence of aortic dissection or other vascular cause.  Initial lab work-up and first troponin are negative, as is the chest x-ray.  We will obtain a repeat troponin and d-dimer to rule out PE.  If these are negative, anticipate discharge home.  ----------------------------------------- 11:32 PM on 11/23/2018 -----------------------------------------  D-dimer was slightly elevated, so we obtained a CT chest which was negative.  Repeat troponin was negative.  At this time the patient is more comfortable and feels well to go home.  She is stable for discharge at this time.  Return precautions given, and she expresses understanding.  ____________________________________________   FINAL CLINICAL IMPRESSION(S) / ED DIAGNOSES  Final diagnoses:  Atypical chest pain      NEW MEDICATIONS STARTED DURING THIS VISIT:  New Prescriptions   No  medications on file     Note:  This document was prepared using Dragon voice recognition software and may include unintentional dictation errors.    Dionne Bucy, MD 11/23/18 2332

## 2018-11-23 NOTE — ED Triage Notes (Signed)
Pt to ED reporting centralized chest pains. Pain radiating to the left ribs. SOB, and lightheadedness reported. Pain is sharp in nature and started suddenly 1-2 hours ago. No cardiac hx. PT verbalized the feeling is similar to a panic attack but also feels different than past panic attacks.   Pt also reporting left  back and leg pains x 2 weeks. PT appears to have pain when ambulating.

## 2018-11-23 NOTE — Discharge Instructions (Signed)
Return to the ER for new, worsening, or persistent severe chest pain, difficulty breathing, weakness or lightheadedness, or any other new or worsening symptoms that concern you.  Follow-up with a primary care doctor.

## 2018-12-01 ENCOUNTER — Other Ambulatory Visit: Payer: Self-pay | Admitting: Certified Nurse Midwife

## 2019-06-06 ENCOUNTER — Encounter: Payer: Self-pay | Admitting: Emergency Medicine

## 2019-06-06 ENCOUNTER — Emergency Department: Payer: BC Managed Care – PPO

## 2019-06-06 ENCOUNTER — Emergency Department
Admission: EM | Admit: 2019-06-06 | Discharge: 2019-06-06 | Disposition: A | Discharge status: Home / Self Care | Payer: BC Managed Care – PPO | Attending: Emergency Medicine | Admitting: Emergency Medicine

## 2019-06-06 ENCOUNTER — Other Ambulatory Visit: Payer: Self-pay

## 2019-06-06 DIAGNOSIS — F17210 Nicotine dependence, cigarettes, uncomplicated: Secondary | ICD-10-CM | POA: Diagnosis not present

## 2019-06-06 DIAGNOSIS — R531 Weakness: Secondary | ICD-10-CM | POA: Insufficient documentation

## 2019-06-06 DIAGNOSIS — R42 Dizziness and giddiness: Secondary | ICD-10-CM | POA: Diagnosis present

## 2019-06-06 LAB — URINALYSIS, COMPLETE (UACMP) WITH MICROSCOPIC
Bacteria, UA: NONE SEEN
Bilirubin Urine: NEGATIVE
Glucose, UA: NEGATIVE mg/dL
Hgb urine dipstick: NEGATIVE
Ketones, ur: NEGATIVE mg/dL
Leukocytes,Ua: NEGATIVE
Nitrite: NEGATIVE
Protein, ur: NEGATIVE mg/dL
Specific Gravity, Urine: 1.006 (ref 1.005–1.030)
pH: 7 (ref 5.0–8.0)

## 2019-06-06 LAB — BASIC METABOLIC PANEL
Anion gap: 9 (ref 5–15)
BUN: 14 mg/dL (ref 6–20)
CO2: 25 mmol/L (ref 22–32)
Calcium: 9.2 mg/dL (ref 8.9–10.3)
Chloride: 108 mmol/L (ref 98–111)
Creatinine, Ser: 0.79 mg/dL (ref 0.44–1.00)
GFR calc Af Amer: >60 mL/min (ref >60)
GFR calc non Af Amer: >60 mL/min (ref >60)
Glucose, Bld: 106 mg/dL — ABNORMAL HIGH (ref 70–99)
Potassium: 4 mmol/L (ref 3.5–5.1)
Sodium: 142 mmol/L (ref 135–145)

## 2019-06-06 LAB — CBC
HCT: 40.8 % (ref 36.0–46.0)
Hemoglobin: 13.2 g/dL (ref 12.0–15.0)
MCH: 27.4 pg (ref 26.0–34.0)
MCHC: 32.4 g/dL (ref 30.0–36.0)
MCV: 84.8 fL (ref 80.0–100.0)
Platelets: 286 10*3/uL (ref 150–400)
RBC: 4.81 MIL/uL (ref 3.87–5.11)
RDW: 14.1 % (ref 11.5–15.5)
WBC: 7.6 10*3/uL (ref 4.0–10.5)
nRBC: 0 % (ref 0.0–0.2)

## 2019-06-06 LAB — POCT PREGNANCY, URINE: Preg Test, Ur: NEGATIVE

## 2019-06-06 MED ORDER — LORAZEPAM 0.5 MG PO TABS: 0.5000 mg | ORAL_TABLET | Freq: Two times a day (BID) | ORAL | 0 refills | Status: AC | PRN

## 2019-06-06 MED ORDER — SODIUM CHLORIDE 0.9 % IV BOLUS
1000.0000 mL | Freq: Once | INTRAVENOUS | Status: AC
  Administered 2019-06-06: 11:00:00 1000 mL via INTRAVENOUS

## 2019-06-06 MED ORDER — LORAZEPAM 2 MG/ML IJ SOLN
1.0000 mg | Freq: Once | INTRAMUSCULAR | Status: AC
  Administered 2019-06-06: 11:00:00 1 mg via INTRAVENOUS
  Filled 2019-06-06: qty 1

## 2019-06-06 MED ORDER — SODIUM CHLORIDE 0.9% FLUSH
3.0000 mL | Freq: Once | INTRAVENOUS | Status: AC
  Administered 2019-06-06: 10:00:00 3 mL via INTRAVENOUS

## 2019-06-06 NOTE — ED Provider Notes (Signed)
[facility] Emergency Department Provider Note  Time seen: 10:31 AM  I have reviewed the triage vital signs and the nursing notes.   HISTORY  Chief Complaint Dizziness   HPI Crystal Cohen is a 43 y.o. female with no past medical history presents to the emergency department for dizziness/lightheadedness.  According to the patient over the past 6 to 8 months she has been experiencing migraine headaches.  States she also has a longstanding issue where she will become weak and sweaty and need to have a bowel movement.  Patient states sometimes she will vomit when this occurs and this has been a chronic issue for her.  She states today while getting ready for work around 7:00 this morning she all of a sudden began feeling very weak and lightheaded.  States her head felt very heavy but denies any headache or pain.  States she felt like she was "drunk" denies slurring her words states she felt like she had a "fat tongue."  Patient states that his symptoms have resolved besides feeling generalized weak/fatigued.  Patient does have a history of panic attacks as well, but states this felt somewhat different than her typical panic attack.  Denies any weakness or numbness of any arm or leg.  Denies any confusion or difficulty thinking.  Largely negative review of systems including no fever cough congestion or shortness of breath.  Past Medical History:  Diagnosis Date  . Constipation 2011  . Lump or mass in breast    left  . Vaginal delivery 1993, 2002    Patient Active Problem List   Diagnosis Date Noted  . 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 Lump or mass in breast 04/07/2013    Past Surgical History:  Procedure Laterality Date  . CESAREAN SECTION  2011  . SCAR REVISION N/A 04/25/2015   Procedure: Cesarean Section SCAR Mass/Nodule Excision;  Surgeon: Shea EvansVaishali Mody, MD;  Location: [facility];  Service: Gynecology;  Laterality: N/A;  1 hr.  . TONSILLECTOMY  as child     Prior to Admission medications   Medication Sig Start Date End Date Taking? Authorizing Provider  amoxicillin (AMOXIL) 500 MG capsule TAKE 1 CAPSULE BY MOUTH THREE TIMES A DAY UNTIL GONE 06/01/18   [provider]  ibuprofen (ADVIL,MOTRIN) 600 MG tablet Take 1 tablet (600 mg total) by mouth every 8 (eight) hours as needed for mild pain or moderate pain. 04/25/15   Shea EvansMody, Vaishali, MD  varenicline (CHANTIX) 0.5 MG tablet Take 1 tablet (0.5 mg total) by mouth 2 (two) times daily. Days 1 to 3: 0.5 mg once daily.Days 4 to 7: 0.5 mg twice daily 06/07/18   Doreene Burkehompson, Annie, CNM    No Known Allergies  Family History  Problem Relation Age of Onset  . Breast cancer Paternal Grandmother        double mastectomy    Social History Social History   Tobacco Use  . Smoking status: Current Every Day Smoker    Packs/day: 0.50    Years: 15.00    Pack years: 7.50    Types: Cigarettes  . Smokeless tobacco: Never Used  Substance Use Topics  . Alcohol use: No  . Drug use: No    Review of Systems Constitutional: Negative for fever.  Positive generalized fatigue/weakness. ENT: Negative for recent illness/congestion Cardiovascular: Negative for chest pain. Respiratory: Negative for shortness of breath. Gastrointestinal: Negative for abdominal pain Genitourinary: Negative for urinary compaints Musculoskeletal: Negative for musculoskeletal complaints Skin: Negative for skin  complaints  Neurological: Negative for headache, but did state her head felt "heavy." All other ROS negative  ____________________________________________   PHYSICAL EXAM:  VITAL SIGNS: ED Triage Vitals [06/06/19 0946]  Enc Vitals Group     BP 127/80     Pulse Rate 88     Resp 17     Temp 98.8 F (37.1 C)     Temp Source Oral     SpO2 96 %     Weight 155 lb (70.3 kg)     Height 5\' 4"  (1.626 m)     Head Circumference      Peak Flow      Pain Score 0     Pain Loc      Pain Edu?      Excl. in [place]?     Constitutional: Alert and oriented. Well appearing and in no distress. Eyes: Normal exam ENT      Head: Normocephalic and atraumatic.      Mouth/Throat: Mucous membranes are moist. Cardiovascular: Normal rate, regular rhythm.  Respiratory: Normal respiratory effort without tachypnea nor retractions. Breath sounds are clear  Gastrointestinal: Soft and nontender. No distention.  Musculoskeletal: Nontender with normal range of motion in all extremities.  Neurologic:  Normal speech and language. No gross focal neurologic deficits.  Equal grip strength bilaterally.  No pronator drift.  5/5 motor in all extremities without lower extremity drift.  Cranial nerves intact. Skin:  Skin is warm, dry and intact.  Psychiatric: Mood and affect are normal.   ____________________________________________    EKG  EKG viewed and interpreted by myself shows a normal sinus rhythm at 79 bpm with a narrow QRS, normal axis, normal intervals, no concerning ST changes.  ____________________________________________    RADIOLOGY  CT negative  ____________________________________________   INITIAL IMPRESSION / ASSESSMENT AND PLAN / ED COURSE  Pertinent labs & imaging results that were available during my care of the patient were reviewed by me and considered in my medical decision making (see chart for details).   Patient presents to the emergency department for generalized fatigue/weakness, feeling of heaviness in her head, felt "drunk."  Differential this time would include intracranial abnormality, we will obtain a CT scan of the head given new onset migraine headaches over the past 6 to 8 months.  Differential would also include electrolyte or metabolic abnormality we will check labs, infectious etiology we will obtain a urine sample.  Overall the patient appears well with a reassuring exam including a normal neurological exam.  Patient's work-up is overall very reassuring.  Patient states she feels  much better after taking the Ativan.  We will discharge with a short course of the same I discussed not drinking or driving while taking the medication.  Patient will follow-up with her doctor.  Crystal Cohen was evaluated in Emergency Department on 06/06/2019 for the symptoms described in the history of present illness. She was evaluated in the context of the global COVID-19 pandemic, which necessitated consideration that the patient might be at risk for infection with the SARS-CoV-2 virus that causes COVID-19. Institutional protocols and algorithms that pertain to the evaluation of patients at risk for COVID-19 are in a state of rapid change based on information released by regulatory bodies including the CDC and federal and state organizations. These policies and algorithms were followed during the patient's care in the ED.  ____________________________________________   FINAL CLINICAL IMPRESSION(S) / ED DIAGNOSES  Weakness   Harvest Dark, MD 06/06/19 1232

## 2019-06-06 NOTE — ED Notes (Signed)
Pt states she felt fine this morning when she woke up and this suddenly around 7am she had sudden dizziness, "felt like my head was going back and forth but it wasn't". Denies having any pain. Pt is a/ox4 on arrival. No noted weakness or changes in speech , hand grips equal.

## 2019-06-06 NOTE — ED Triage Notes (Signed)
Pt in via POV, reports sudden onset dizziness, light headedness, pt states, "My head feels like a bowling ball."  Pt with difficulty getting words out in triage.  Approximate onset 0700.  Spouse reports similar episodes in the past.  Vitals WDL.

## 2019-06-06 NOTE — ED Notes (Signed)
Pt returned from [place], assisted to the toilet

## 2019-06-24 ENCOUNTER — Other Ambulatory Visit: Payer: Self-pay

## 2019-06-24 ENCOUNTER — Ambulatory Visit (INDEPENDENT_AMBULATORY_CARE_PROVIDER_SITE_OTHER): Payer: BC Managed Care – PPO | Admitting: Certified Nurse Midwife

## 2019-06-24 ENCOUNTER — Encounter: Payer: Self-pay | Admitting: Certified Nurse Midwife

## 2019-06-24 VITALS — BP 122/75 | HR 85 | Ht 64.0 in | Wt 154.4 lb

## 2019-06-24 DIAGNOSIS — R5383 Other fatigue: Secondary | ICD-10-CM | POA: Diagnosis not present

## 2019-06-24 DIAGNOSIS — Z1239 Encounter for other screening for malignant neoplasm of breast: Secondary | ICD-10-CM | POA: Diagnosis not present

## 2019-06-24 DIAGNOSIS — Z01419 Encounter for gynecological examination (general) (routine) without abnormal findings: Secondary | ICD-10-CM | POA: Diagnosis not present

## 2019-06-24 NOTE — Progress Notes (Signed)
GYNECOLOGY ANNUAL PREVENTATIVE CARE ENCOUNTER NOTE  History:     Crystal Cohen is a 43 y.o. 593P0 female here for a routine annual gynecologic exam.  Current complaints fatigue and : panic attack x 1 went to hospital for dizziness and slurred speach.Denies having had once since then.    Denies abnormal vaginal bleeding, discharge, pelvic pain, problems with intercourse or other gynecologic concerns.    Gynecologic History Patient's last menstrual period was 06/10/2019 (exact date). Contraception: tubal ligation Last Pap: .06/07/18 Results were: normal with negative HPV Last mammogram: pt did not have last year.   Obstetric History OB History  Gravida Para Term Preterm AB Living  3         4  SAB TAB Ectopic Multiple Live Births        1      # Outcome Date GA Lbr Len/2nd Weight Sex Delivery Anes PTL Lv  3 Gravida           2 Gravida           1 Gravida             Obstetric Comments  Menstrual age: 89    Age 1st Pregnancy:16    Past Medical History:  Diagnosis Date  . Constipation 2011  . Lump or mass in breast    left  . Vaginal delivery 1993, 2002    Past Surgical History:  Procedure Laterality Date  . CESAREAN SECTION  2011  . SCAR REVISION N/A 04/25/2015   Procedure: Cesarean Section SCAR Mass/Nodule Excision;  Surgeon: Shea EvansVaishali Mody, MD;  Location: [facility];  Service: Gynecology;  Laterality: N/A;  1 hr.  . TONSILLECTOMY  as child    Current Outpatient Medications on File Prior to Visit  Medication Sig Dispense Refill  . ibuprofen (ADVIL,MOTRIN) 600 MG tablet Take 1 tablet (600 mg total) by mouth every 8 (eight) hours as needed for mild pain or moderate pain. 20 tablet 0  . LORazepam (ATIVAN) 0.5 MG tablet Take 1 tablet (0.5 mg total) by mouth 2 (two) times daily as needed for anxiety. (Patient not taking: Reported on 06/24/2019) 15 tablet 0  . varenicline (CHANTIX) 0.5 MG tablet Take 1 tablet (0.5 mg total) by mouth 2 (two) times daily. Days 1 to 3: 0.5 mg once  daily.Days 4 to 7: 0.5 mg twice daily (Patient not taking: Reported on 06/24/2019) 60 tablet 3   No current facility-administered medications on file prior to visit.     No Known Allergies  Social History:  reports that she has been smoking cigarettes. She has a 7.50 pack-year smoking history. She has never used smokeless tobacco. She reports that she does not drink alcohol or use drugs.  Family History  Problem Relation Age of Onset  . Breast cancer Paternal Grandmother        double mastectomy    The following portions of the patient's history were reviewed and updated as appropriate: allergies, current medications, past family history, past medical history, past social history, past surgical history and problem list.  Review of Systems Pertinent items noted in HPI and remainder of comprehensive ROS otherwise negative.  Physical Exam:  BP 122/75   Pulse 85   Ht 5\' 4"  (1.626 m)   Wt 154 lb 6 oz (70 kg)   LMP 06/10/2019 (Exact Date)   BMI 26.50 kg/m  CONSTITUTIONAL: Well-developed, well-nourished female in no acute distress.  HENT:  Normocephalic, atraumatic, External right and left ear normal. Oropharynx is  clear and moist EYES: Conjunctivae and EOM are normal. Pupils are equal, round, and reactive to light. No scleral icterus.  NECK: Normal range of motion, supple, no masses.  Normal thyroid.  SKIN: Skin is warm and dry. No rash noted. Not diaphoretic. No erythema. No pallor. MUSCULOSKELETAL: Normal range of motion. No tenderness.  No cyanosis, clubbing, or edema.  2+ distal pulses. NEUROLOGIC: Alert and oriented to person, place, and time. Normal reflexes, muscle tone coordination. No cranial nerve deficit noted. PSYCHIATRIC: Normal mood and affect. Normal behavior. Normal judgment and thought content. CARDIOVASCULAR: Normal heart rate noted, regular rhythm RESPIRATORY: Clear to auscultation bilaterally. Effort and breath sounds normal, no problems with respiration  noted. BREASTS: Symmetric in size. No masses, skin changes, nipple drainage, or lymphadenopathy. ABDOMEN: Soft, normal bowel sounds, no distention noted.  No tenderness, rebound or guarding.  PELVIC: Normal appearing external genitalia; normal appearing vaginal mucosa and cervix.  No abnormal discharge noted.  Pap smear not indicated.   Normal uterine size, no other palpable masses, no uterine or adnexal tenderness.   Assessment and Plan:  Annual GYN Exam  Pap not indicated Mammogram ordered.  Labs: TSH, Vitamin D, ( pt had CBC in ED) Discussed referral if needed for panic attack, pt declines at this time.  Pt states chantix worked but her 46 yr old niece passed from overdose which cause a relapse. She is not ready to quit at this time.  Routine preventative health maintenance measures emphasized. Please refer to After Visit Summary for other counseling recommendations.      Philip Aspen, CNM

## 2019-06-24 NOTE — Patient Instructions (Signed)

## 2019-06-25 LAB — VITAMIN D 25 HYDROXY (VIT D DEFICIENCY, FRACTURES): Vit D, 25-Hydroxy: 23.6 ng/mL — ABNORMAL LOW (ref 30.0–100.0)

## 2019-06-25 LAB — THYROID PANEL WITH TSH
Free Thyroxine Index: 1.9 (ref 1.2–4.9)
T3 Uptake Ratio: 23 % — ABNORMAL LOW (ref 24–39)
T4, Total: 8.2 ug/dL (ref 4.5–12.0)
TSH: 1.61 u[IU]/mL (ref 0.450–4.500)

## 2019-07-29 ENCOUNTER — Ambulatory Visit
Admission: RE | Admit: 2019-07-29 | Discharge: 2019-07-29 | Disposition: A | Discharge status: Home / Self Care | Payer: BC Managed Care – PPO | Source: Ambulatory Visit | Attending: Certified Nurse Midwife | Admitting: Certified Nurse Midwife

## 2019-07-29 DIAGNOSIS — Z01419 Encounter for gynecological examination (general) (routine) without abnormal findings: Secondary | ICD-10-CM

## 2019-07-29 DIAGNOSIS — Z1231 Encounter for screening mammogram for malignant neoplasm of breast: Secondary | ICD-10-CM | POA: Diagnosis not present

## 2019-07-29 DIAGNOSIS — Z1239 Encounter for other screening for malignant neoplasm of breast: Secondary | ICD-10-CM

## 2019-08-08 ENCOUNTER — Other Ambulatory Visit: Payer: Self-pay | Admitting: Certified Nurse Midwife

## 2019-08-08 DIAGNOSIS — R928 Other abnormal and inconclusive findings on diagnostic imaging of breast: Secondary | ICD-10-CM

## 2019-08-08 DIAGNOSIS — N6320 Unspecified lump in the left breast, unspecified quadrant: Secondary | ICD-10-CM

## 2019-08-15 ENCOUNTER — Ambulatory Visit
Admission: RE | Admit: 2019-08-15 | Discharge: 2019-08-15 | Disposition: A | Discharge status: Home / Self Care | Payer: BC Managed Care – PPO | Source: Ambulatory Visit | Attending: Certified Nurse Midwife | Admitting: Certified Nurse Midwife

## 2019-08-15 DIAGNOSIS — N6320 Unspecified lump in the left breast, unspecified quadrant: Secondary | ICD-10-CM

## 2019-08-15 DIAGNOSIS — R928 Other abnormal and inconclusive findings on diagnostic imaging of breast: Secondary | ICD-10-CM | POA: Insufficient documentation

## 2019-10-17 ENCOUNTER — Other Ambulatory Visit: Payer: Self-pay | Admitting: Certified Nurse Midwife

## 2019-10-17 DIAGNOSIS — N6489 Other specified disorders of breast: Secondary | ICD-10-CM

## 2019-11-18 ENCOUNTER — Ambulatory Visit
Admission: RE | Admit: 2019-11-18 | Discharge: 2019-11-18 | Disposition: A | Discharge status: Home / Self Care | Payer: BC Managed Care – PPO | Source: Ambulatory Visit | Attending: Certified Nurse Midwife | Admitting: Certified Nurse Midwife

## 2019-11-18 DIAGNOSIS — N6489 Other specified disorders of breast: Secondary | ICD-10-CM | POA: Insufficient documentation

## 2019-12-06 ENCOUNTER — Other Ambulatory Visit: Payer: Self-pay

## 2019-12-06 ENCOUNTER — Telehealth: Payer: Self-pay | Admitting: Certified Nurse Midwife

## 2019-12-06 MED ORDER — VARENICLINE TARTRATE 0.5 MG PO TABS: 0.5000 mg | ORAL_TABLET | Freq: Two times a day (BID) | ORAL | 3 refills | Status: DC

## 2019-12-06 NOTE — Telephone Encounter (Signed)
Pt called in and stated that she needs a refill on varenicline (CHANTIX). The pt stated that she was told to call back. Please advise  Sent to DN, SS is off

## 2019-12-06 NOTE — Telephone Encounter (Signed)
Prescription sent.  Attempted to contact patient, no answer.  LMTRC if she needed anything else.

## 2020-09-20 ENCOUNTER — Telehealth: Payer: Self-pay

## 2020-09-20 NOTE — Telephone Encounter (Signed)
mychart sent.

## 2020-09-26 ENCOUNTER — Telehealth: Payer: Self-pay

## 2020-09-26 NOTE — Telephone Encounter (Signed)
Patient called in stating that she just had a loss in her family a couple of weeks ago and that she was wanting depression medication called in for her to help her cope with the death. Informed patient that I was sorry for her loss and that I would send a message back to her provider to see what they could do for her. I did inform patient that we are a OB-GYN and that I was unsure if that would be able to be prescribed to her, however I would send a message back. Patient verbalized understanding.  Could you please advise?

## 2020-09-26 NOTE — Telephone Encounter (Signed)
mychart message sent to patient

## 2020-10-04 NOTE — Progress Notes (Signed)
Annual exam-Pt stated that she was doing well no problems. PHQ-9=12.

## 2020-10-04 NOTE — Patient Instructions (Signed)
Preventive Care 40-44 Years Old, Female Preventive care refers to visits with your health care provider and lifestyle choices that can promote health and wellness. This includes:  A yearly physical exam. This may also be called an annual well check.  Regular dental visits and eye exams.  Immunizations.  Screening for certain conditions.  Healthy lifestyle choices, such as eating a healthy diet, getting regular exercise, not using drugs or products that contain nicotine and tobacco, and limiting alcohol use. What can I expect for my preventive care visit? Physical exam Your health care provider will check your:  Height and weight. This may be used to calculate body mass index (BMI), which tells if you are at a healthy weight.  Heart rate and blood pressure.  Skin for abnormal spots. Counseling Your health care provider may ask you questions about your:  Alcohol, tobacco, and drug use.  Emotional well-being.  Home and relationship well-being.  Sexual activity.  Eating habits.  Work and work environment.  Method of birth control.  Menstrual cycle.  Pregnancy history. What immunizations do I need?  Influenza (flu) vaccine  This is recommended every year. Tetanus, diphtheria, and pertussis (Tdap) vaccine  You may need a Td booster every 10 years. Varicella (chickenpox) vaccine  You may need this if you have not been vaccinated. Zoster (shingles) vaccine  You may need this after age 60. Measles, mumps, and rubella (MMR) vaccine  You may need at least one dose of MMR if you were born in 1957 or later. You may also need a second dose. Pneumococcal conjugate (PCV13) vaccine  You may need this if you have certain conditions and were not previously vaccinated. Pneumococcal polysaccharide (PPSV23) vaccine  You may need one or two doses if you smoke cigarettes or if you have certain conditions. Meningococcal conjugate (MenACWY) vaccine  You may need this if you  have certain conditions. Hepatitis A vaccine  You may need this if you have certain conditions or if you travel or work in places where you may be exposed to hepatitis A. Hepatitis B vaccine  You may need this if you have certain conditions or if you travel or work in places where you may be exposed to hepatitis B. Haemophilus influenzae type b (Hib) vaccine  You may need this if you have certain conditions. Human papillomavirus (HPV) vaccine  If recommended by your health care provider, you may need three doses over 6 months. You may receive vaccines as individual doses or as more than one vaccine together in one shot (combination vaccines). Talk with your health care provider about the risks and benefits of combination vaccines. What tests do I need? Blood tests  Lipid and cholesterol levels. These may be checked every 5 years, or more frequently if you are over 50 years old.  Hepatitis C test.  Hepatitis B test. Screening  Lung cancer screening. You may have this screening every year starting at age 55 if you have a 30-pack-year history of smoking and currently smoke or have quit within the past 15 years.  Colorectal cancer screening. All adults should have this screening starting at age 50 and continuing until age 75. Your health care provider may recommend screening at age 45 if you are at increased risk. You will have tests every 1-10 years, depending on your results and the type of screening test.  Diabetes screening. This is done by checking your blood sugar (glucose) after you have not eaten for a while (fasting). You may have this   done every 1-3 years.  Mammogram. This may be done every 1-2 years. Talk with your health care provider about when you should start having regular mammograms. This may depend on whether you have a family history of breast cancer.  BRCA-related cancer screening. This may be done if you have a family history of breast, ovarian, tubal, or peritoneal  cancers.  Pelvic exam and Pap test. This may be done every 3 years starting at age 60. Starting at age 7, this may be done every 5 years if you have a Pap test in combination with an HPV test. Other tests  Sexually transmitted disease (STD) testing.  Bone density scan. This is done to screen for osteoporosis. You may have this scan if you are at high risk for osteoporosis. Follow these instructions at home: Eating and drinking  Eat a diet that includes fresh fruits and vegetables, whole grains, lean protein, and low-fat dairy.  Take vitamin and mineral supplements as recommended by your health care provider.  Do not drink alcohol if: ? Your health care provider tells you not to drink. ? You are pregnant, may be pregnant, or are planning to become pregnant.  If you drink alcohol: ? Limit how much you have to 0-1 drink a day. ? Be aware of how much alcohol is in your drink. In the [place] one drink equals one 12 oz bottle of beer (355 mL), one 5 oz glass of wine (148 mL), or one 1 oz glass of hard liquor (44 mL). Lifestyle  Take daily care of your teeth and gums.  Stay active. Exercise for at least 30 minutes on 5 or more days each week.  Do not use any products that contain nicotine or tobacco, such as cigarettes, e-cigarettes, and chewing tobacco. If you need help quitting, ask your health care provider.  If you are sexually active, practice safe sex. Use a condom or other form of birth control (contraception) in order to prevent pregnancy and STIs (sexually transmitted infections).  If told by your health care provider, take low-dose aspirin daily starting at age 48. What's next?  Visit your health care provider once a year for a well check visit.  Ask your health care provider how often you should have your eyes and teeth checked.  Stay up to date on all vaccines. This information is not intended to replace advice given to you by your health care provider. Make sure you  discuss any questions you have with your health care provider. Document Revised: 06/10/2018 Document Reviewed: 06/10/2018 Elsevier Patient Education  2020 [place] Breast self-awareness is knowing how your breasts look and feel. Doing breast self-awareness is important. It allows you to catch a breast problem early while it is still small and can be treated. All women should do breast self-awareness, including women who have had breast implants. Tell your doctor if you notice a change in your breasts. What you need:  A mirror.  A well-lit room. How to do a breast self-exam A breast self-exam is one way to learn what is normal for your breasts and to check for changes. To do a breast self-exam: Look for changes  1. Take off all the clothes above your waist. 2. Stand in front of a mirror in a room with good lighting. 3. Put your hands on your hips. 4. Push your hands down. 5. Look at your breasts and nipples in the mirror to see if one breast or nipple looks different from the  other. Check to see if: ? The shape of one breast is different. ? The size of one breast is different. ? There are wrinkles, dips, and bumps in one breast and not the other. 6. Look at each breast for changes in the skin, such as: ? Redness. ? Scaly areas. 7. Look for changes in your nipples, such as: ? Liquid around the nipples. ? Bleeding. ? Dimpling. ? Redness. ? A change in where the nipples are. Feel for changes  1. Lie on your back on the floor. 2. Feel each breast. To do this, follow these steps: ? Pick a breast to feel. ? Put the arm closest to that breast above your head. ? Use your other arm to feel the nipple area of your breast. Feel the area with the pads of your three middle fingers by making small circles with your fingers. For the first circle, press lightly. For the second circle, press harder. For the third circle, press even harder. ? Keep making circles with  your fingers at the different pressures as you move down your breast. Stop when you feel your ribs. ? Move your fingers a little toward the center of your body. ? Start making circles with your fingers again, this time going up until you reach your collarbone. ? Keep making up-and-down circles until you reach your armpit. Remember to keep using the three pressures. ? Feel the other breast in the same way. 3. Sit or stand in the tub or shower. 4. With soapy water on your skin, feel each breast the same way you did in step 2 when you were lying on the floor. Write down what you find Writing down what you find can help you remember what to tell your doctor. Write down:  What is normal for each breast.  Any changes you find in each breast, including: ? The kind of changes you find. ? Whether you have pain. ? Size and location of any lumps.  When you last had your menstrual period. General tips  Check your breasts every month.  If you are breastfeeding, the best time to check your breasts is after you feed your baby or after you use a breast pump.  If you get menstrual periods, the best time to check your breasts is 5-7 days after your menstrual period is over.  With time, you will become comfortable with the self-exam, and you will begin to know if there are changes in your breasts. Contact a doctor if you:  See a change in the shape or size of your breasts or nipples.  See a change in the skin of your breast or nipples, such as red or scaly skin.  Have fluid coming from your nipples that is not normal.  Find a lump or thick area that was not there before.  Have pain in your breasts.  Have any concerns about your breast health. Summary  Breast self-awareness includes looking for changes in your breasts, as well as feeling for changes within your breasts.  Breast self-awareness should be done in front of a mirror in a well-lit room.  You should check your breasts every month.  If you get menstrual periods, the best time to check your breasts is 5-7 days after your menstrual period is over.  Let your doctor know of any changes you see in your breasts, including changes in size, changes on the skin, pain or tenderness, or fluid from your nipples that is not normal. This information is not  intended to replace advice given to you by your health care provider. Make sure you discuss any questions you have with your health care provider. Document Revised: 05/18/2018 Document Reviewed: 05/18/2018 Elsevier Patient Education  [place].

## 2020-10-08 ENCOUNTER — Other Ambulatory Visit: Payer: Self-pay

## 2020-10-08 ENCOUNTER — Encounter: Payer: Self-pay | Admitting: Certified Nurse Midwife

## 2020-10-08 ENCOUNTER — Ambulatory Visit (INDEPENDENT_AMBULATORY_CARE_PROVIDER_SITE_OTHER): Payer: Managed Care, Other (non HMO) | Admitting: Certified Nurse Midwife

## 2020-10-08 VITALS — BP 110/71 | HR 75 | Ht 64.0 in | Wt 151.8 lb

## 2020-10-08 DIAGNOSIS — Z114 Encounter for screening for human immunodeficiency virus [HIV]: Secondary | ICD-10-CM

## 2020-10-08 DIAGNOSIS — Z1231 Encounter for screening mammogram for malignant neoplasm of breast: Secondary | ICD-10-CM | POA: Diagnosis not present

## 2020-10-08 DIAGNOSIS — Z01419 Encounter for gynecological examination (general) (routine) without abnormal findings: Secondary | ICD-10-CM | POA: Diagnosis not present

## 2020-10-08 DIAGNOSIS — R6882 Decreased libido: Secondary | ICD-10-CM

## 2020-10-08 DIAGNOSIS — Z1159 Encounter for screening for other viral diseases: Secondary | ICD-10-CM | POA: Diagnosis not present

## 2020-10-08 MED ORDER — ADDYI 100 MG PO TABS: 1.0000 | ORAL_TABLET | Freq: Every day | ORAL | 3 refills | Status: DC

## 2020-10-08 MED ORDER — SERTRALINE HCL 50 MG PO TABS: 50.0000 mg | ORAL_TABLET | Freq: Every day | ORAL | 1 refills | Status: DC

## 2020-10-08 NOTE — Progress Notes (Signed)
GYNECOLOGY ANNUAL PREVENTATIVE CARE ENCOUNTER NOTE  History:     Crystal Cohen is a 44 y.o. G42P0 female here for a routine annual gynecologic exam.  Current complaints: **decreased sexual desire. Would like labs done. Depression, her dad passed Dec.9th, states she does not want to get out of bed in the morning. Is tearful. Has history of depression was on zoloft in the past. State that she tried to to get in with tele health and psychiatrist no appointments..   Denies abnormal vaginal bleeding, discharge, pelvic pain, problems with intercourse or other gynecologic concerns.     Social Relationship:Married  Living: Husband and 47 yr old sons (twnis) Work: [employer] Exercise: none Smoke/Alcohol/drug OMB:TDHRCBU smoking again when dad passed 1/2 pk day. Denies drugs and alcohol use.   Gynecologic History Patient's last menstrual period was 09/21/2020. Contraception: tubal ligation Last Pap: 06/07/2018. Results were: normal with negative HPV Last mammogram:11/18/2019. Results were: Stable left breast benign fibroadenolipoma (hamartoma). No evidence of malignancy.  Obstetric History OB History  Gravida Para Term Preterm AB Living  3         4  SAB IAB Ectopic Multiple Live Births        1      # Outcome Date GA Lbr Len/2nd Weight Sex Delivery Anes PTL Lv  3 Gravida           2 Gravida           1 Gravida             Obstetric Comments  Menstrual age: 47    Age 1st Pregnancy:16    Past Medical History:  Diagnosis Date  . Constipation 2011  . Lump or mass in breast    left  . Vaginal delivery 1993, 2002    Past Surgical History:  Procedure Laterality Date  . CESAREAN SECTION  2011  . SCAR REVISION N/A 04/25/2015   Procedure: Cesarean Section SCAR Mass/Nodule Excision;  Surgeon: Shea Evans, MD;  Location: [facility];  Service: Gynecology;  Laterality: N/A;  1 hr.  . TONSILLECTOMY  as child    No current outpatient medications on file prior to visit.   No current  facility-administered medications on file prior to visit.    No Known Allergies  Social History:  reports that she has been smoking cigarettes. She has a 7.50 pack-year smoking history. She has never used smokeless tobacco. She reports that she does not drink alcohol and does not use drugs.  Family History  Problem Relation Age of Onset  . Breast cancer Paternal Grandmother        double mastectomy  . Heart disease Father     The following portions of the patient's history were reviewed and updated as appropriate: allergies, current medications, past family history, past medical history, past social history, past surgical history and problem list.  Flowsheet Row Office Visit from 10/08/2020 in [facility]  PHQ-9 Total Score 12      Review of Systems Pertinent items noted in HPI and remainder of comprehensive ROS otherwise negative.  Physical Exam:  BP 110/71   Pulse 75   Ht 5\' 4"  (1.626 m)   Wt 151 lb 12.8 oz (68.9 kg)   LMP 09/21/2020   BMI 26.06 kg/m  CONSTITUTIONAL: Well-developed, well-nourished female in no acute distress.  HENT:  Normocephalic, atraumatic, External right and left ear normal. Oropharynx is clear and moist EYES: Conjunctivae and EOM are normal. Pupils are equal, round, and reactive  to light. No scleral icterus.  NECK: Normal range of motion, supple, no masses.  Normal thyroid.  SKIN: Skin is warm and dry. No rash noted. Not diaphoretic. No erythema. No pallor. MUSCULOSKELETAL: Normal range of motion. No tenderness.  No cyanosis, clubbing, or edema.  2+ distal pulses. NEUROLOGIC: Alert and oriented to person, place, and time. Normal reflexes, muscle tone coordination.  PSYCHIATRIC: Normal mood and affect. Normal behavior. Normal judgment and thought content. CARDIOVASCULAR: Normal heart rate noted, regular rhythm RESPIRATORY: Clear to auscultation bilaterally. Effort and breath sounds normal, no problems with respiration noted. BREASTS:  Symmetric in size. No masses, tenderness, skin changes, nipple drainage, or lymphadenopathy bilaterally.  ABDOMEN: Soft, no distention noted.  No tenderness, rebound or guarding.  PELVIC: Normal appearing external genitalia and urethral meatus; normal appearing vaginal mucosa and cervix.  No abnormal discharge noted.  Pap smear not done  Normal uterine size, no other palpable masses, no uterine or adnexal tenderness.  .   Assessment and Plan:    1. Encounter for well woman exam with routine gynecological exam  Pap:not due Mammogram :ordered Labs: Hep C, HIV, TSH, testosterone  Refills: Zoloft, Addyi ( discussed use SSRI decrease of sexual drive).  Referral:none, declines counseling at this time Routine preventative health maintenance measures emphasized. Please refer to After Visit Summary for other counseling recommendations.      Doreene Burke, CNM [facility],  [facility]

## 2020-10-09 LAB — THYROID PANEL WITH TSH
Free Thyroxine Index: 1.7 (ref 1.2–4.9)
T3 Uptake Ratio: 22 % — ABNORMAL LOW (ref 24–39)
T4, Total: 7.9 ug/dL (ref 4.5–12.0)
TSH: 1.21 u[IU]/mL (ref 0.450–4.500)

## 2020-10-09 LAB — HIV ANTIBODY (ROUTINE TESTING W REFLEX): HIV Screen 4th Generation wRfx: NONREACTIVE

## 2020-10-09 LAB — TESTOSTERONE: Testosterone: 20 ng/dL (ref 4–50)

## 2020-10-09 LAB — HEPATITIS C ANTIBODY: Hep C Virus Ab: <0.1 s/co ratio (ref 0.0–0.9)

## 2020-11-01 ENCOUNTER — Other Ambulatory Visit: Payer: Self-pay | Admitting: Certified Nurse Midwife

## 2020-11-07 ENCOUNTER — Ambulatory Visit (INDEPENDENT_AMBULATORY_CARE_PROVIDER_SITE_OTHER): Payer: Managed Care, Other (non HMO) | Admitting: Certified Nurse Midwife

## 2020-11-07 ENCOUNTER — Other Ambulatory Visit: Payer: Self-pay

## 2020-11-07 ENCOUNTER — Encounter: Payer: Self-pay | Admitting: Certified Nurse Midwife

## 2020-11-07 DIAGNOSIS — Z79899 Other long term (current) drug therapy: Secondary | ICD-10-CM | POA: Diagnosis not present

## 2020-11-07 DIAGNOSIS — Z1331 Encounter for screening for depression: Secondary | ICD-10-CM | POA: Diagnosis not present

## 2020-11-07 MED ORDER — SERTRALINE HCL 100 MG PO TABS: 100.0000 mg | ORAL_TABLET | Freq: Every day | ORAL | 1 refills | Status: DC

## 2020-11-07 NOTE — Progress Notes (Signed)
Received a transferred call from [facility] for a televisit. DOB as identifier. PhQ9= 7. Patient is taking Zoloft 50 mg QD. Call transferred to [facility] CNM for completion.

## 2020-11-07 NOTE — Progress Notes (Signed)
Virtual Visit via Telephone Note  I connected with Shanitha Twining Kraai on 11/07/20 at 11:30 AM EST by telephone and verified that I am speaking with the correct person using two identifiers.  Location: Patient: at home  Provider: in office    I discussed the limitations, risks, security and privacy concerns of performing an evaluation and management service by telephone and the availability of in person appointments. I also discussed with the patient that there may be a patient responsible charge related to this service. The patient expressed understanding and agreed to proceed.   History of Present Illness: 45 yr old female started on zoloft 4 wks ago for depression. Her father had recently passed away that was contributing to the derpression    Observations/Objective: State she is doing a little better but still not herself. She notices that she is not crying as much and that her attention span has improved. She is now doing puzzles on the weekend which helps.    PHQ9 SCORE ONLY 11/07/2020 10/08/2020  PHQ-9 Total Score 7 12     Assessment and Plan: She state her step dad is in the hospital and is not doing well and although she is feeling better she still not back to her normal self. Discussed increasing zoloft dose to 100 mg daily. She would like to try this. Orders placed.   Follow Up Instructions: Will follow up 4 wks for medication check.     I discussed the assessment and treatment plan with the patient. The patient was provided an opportunity to ask questions and all were answered. The patient agreed with the plan and demonstrated an understanding of the instructions.   The patient was advised to call back or seek an in-person evaluation if the symptoms worsen or if the condition fails to improve as anticipated.  I provided 7 minutes of non-face-to-face time during this encounter.   Doreene Burke, CNM

## 2020-11-07 NOTE — Patient Instructions (Signed)

## 2020-11-24 ENCOUNTER — Other Ambulatory Visit: Payer: Self-pay | Admitting: Certified Nurse Midwife

## 2020-12-07 ENCOUNTER — Other Ambulatory Visit: Payer: Self-pay | Admitting: Certified Nurse Midwife

## 2020-12-07 MED ORDER — SERTRALINE HCL 100 MG PO TABS: 100.0000 mg | ORAL_TABLET | Freq: Every day | ORAL | 0 refills | Status: DC

## 2020-12-07 NOTE — Telephone Encounter (Signed)
The pt was supposed to have another tele visit or in office visit to evaluate medication effectiveness given that this was an increase in dose. You may refill x 1 month , and inform her to come in or schedule a tele visit for additional refills.   Thanks,  Pattricia Boss

## 2020-12-07 NOTE — Telephone Encounter (Signed)
I am am not sure why this is getting sent back to me. Help.   The can have one refill , she needs a follow up visit with me. I sent this back to [facility] to let her know that she can put in for 1 refill and have she schedule a tele visit or office visit.   Thanks  Pattricia Boss

## 2020-12-07 NOTE — Telephone Encounter (Signed)
OK to fill for 90 day supply? 

## 2020-12-12 ENCOUNTER — Ambulatory Visit (INDEPENDENT_AMBULATORY_CARE_PROVIDER_SITE_OTHER): Payer: Managed Care, Other (non HMO) | Admitting: Certified Nurse Midwife

## 2020-12-12 ENCOUNTER — Other Ambulatory Visit: Payer: Self-pay

## 2020-12-12 ENCOUNTER — Encounter: Payer: Managed Care, Other (non HMO) | Admitting: Certified Nurse Midwife

## 2020-12-12 ENCOUNTER — Encounter: Payer: Self-pay | Admitting: Certified Nurse Midwife

## 2020-12-12 DIAGNOSIS — F419 Anxiety disorder, unspecified: Secondary | ICD-10-CM

## 2020-12-12 MED ORDER — SERTRALINE HCL 100 MG PO TABS: 100.0000 mg | ORAL_TABLET | Freq: Every day | ORAL | 6 refills | Status: DC

## 2020-12-12 NOTE — Progress Notes (Signed)
Virtual Visit via Telephone Note  I connected with Crystal Cohen on 12/12/20 at  8:15 AM EST by telephone and verified that I am speaking with the correct person using two identifiers.  Location: Patient: at home Provider: at the office   I discussed the limitations, risks, security and privacy concerns of performing an evaluation and management service by telephone and the availability of in person appointments. I also discussed with the patient that there may be a patient responsible charge related to this service. The patient expressed understanding and agreed to proceed.   History of Present Illness: Pt was started on Zoloft in December due anxiety that was exacerbated due to her father passing away. Her step father also has been in poor health. In January we increased the dose to 100 mg . We are follow up today to see how this dose is working for her.   Observations/Objective: She reports feeling much better . Having more good days than bad. She states that her husband has noticed a difference as well . Things used to get "under her skin" and that it not happening as much.  GAD 7 : Generalized Anxiety Score 12/12/2020  Nervous, Anxious, on Edge 1  Control/stop worrying 1  Worry too much - different things 1  Trouble relaxing 0  Restless 0  Easily annoyed or irritable 0  Afraid - awful might happen 0  Total GAD 7 Score 3  Anxiety Difficulty Not difficult at all       Assessment and Plan: Her anxiety has much improved and she is happy at this dose of zoloft. Refill ordered for 6 months.   Follow Up Instructions: Follow up in 5-6 months to discuss continuation of medication if needed.    I discussed the assessment and treatment plan with the patient. The patient was provided an opportunity to ask questions and all were answered. The patient agreed with the plan and demonstrated an understanding of the instructions.   The patient was advised to call back or seek an in-person  evaluation if the symptoms worsen or if the condition fails to improve as anticipated.  I provided 5 minutes of non-face-to-face time during this encounter.   Doreene Burke, CNM

## 2020-12-12 NOTE — Patient Instructions (Signed)

## 2021-01-01 ENCOUNTER — Other Ambulatory Visit: Payer: Self-pay

## 2021-01-01 MED ORDER — SERTRALINE HCL 100 MG PO TABS: 100.0000 mg | ORAL_TABLET | Freq: Every day | ORAL | 3 refills | Status: DC

## 2021-04-29 ENCOUNTER — Telehealth: Payer: Managed Care, Other (non HMO) | Admitting: Certified Nurse Midwife

## 2021-04-29 ENCOUNTER — Other Ambulatory Visit: Payer: Self-pay | Admitting: Certified Nurse Midwife

## 2021-04-29 DIAGNOSIS — K625 Hemorrhage of anus and rectum: Secondary | ICD-10-CM

## 2021-04-29 NOTE — Telephone Encounter (Signed)
Pt called stating that she is having bleeding from bottom  when having bowel movements, asked if annie needs to see her. I made her aware that it is more of a GI issue. Pt states that annie had mentioned that in past and asked who we normally recommend, I told her [facility]. I told her if she needs a referral to let us know.

## 2021-04-30 NOTE — Telephone Encounter (Signed)
Spoke with patient letting her know her GI referral was sent and they should be calling her to schedule. No further questions voiced from patient.

## 2021-05-03 ENCOUNTER — Other Ambulatory Visit: Payer: Self-pay

## 2021-05-03 ENCOUNTER — Ambulatory Visit
Admission: RE | Admit: 2021-05-03 | Discharge: 2021-05-03 | Disposition: A | Discharge status: Home / Self Care | Payer: Managed Care, Other (non HMO) | Source: Ambulatory Visit | Attending: Certified Nurse Midwife | Admitting: Certified Nurse Midwife

## 2021-05-03 DIAGNOSIS — Z01419 Encounter for gynecological examination (general) (routine) without abnormal findings: Secondary | ICD-10-CM | POA: Diagnosis present

## 2021-05-03 DIAGNOSIS — Z1231 Encounter for screening mammogram for malignant neoplasm of breast: Secondary | ICD-10-CM | POA: Diagnosis present

## 2021-07-09 ENCOUNTER — Ambulatory Visit: Payer: Self-pay | Admitting: Gastroenterology

## 2021-09-04 ENCOUNTER — Ambulatory Visit: Payer: Managed Care, Other (non HMO) | Admitting: Gastroenterology

## 2021-09-09 DIAGNOSIS — F411 Generalized anxiety disorder: Secondary | ICD-10-CM | POA: Insufficient documentation

## 2021-09-09 DIAGNOSIS — F332 Major depressive disorder, recurrent severe without psychotic features: Secondary | ICD-10-CM | POA: Insufficient documentation

## 2021-09-09 DIAGNOSIS — N951 Menopausal and female climacteric states: Secondary | ICD-10-CM | POA: Insufficient documentation

## 2021-10-27 IMAGING — MG MM DIGITAL DIAGNOSTIC UNILAT*L* W/ TOMO
8 series · 8 of 24 positions shown · non-contrast
Comparison: Previous exam(s).

CLINICAL DATA: Patient recalled from screening for left breast
asymmetry.

EXAM:
DIGITAL DIAGNOSTIC LEFT MAMMOGRAM WITH CAD AND TOMO
ULTRASOUND LEFT BREAST

[L XCCL synth-2D (1 of 2)]
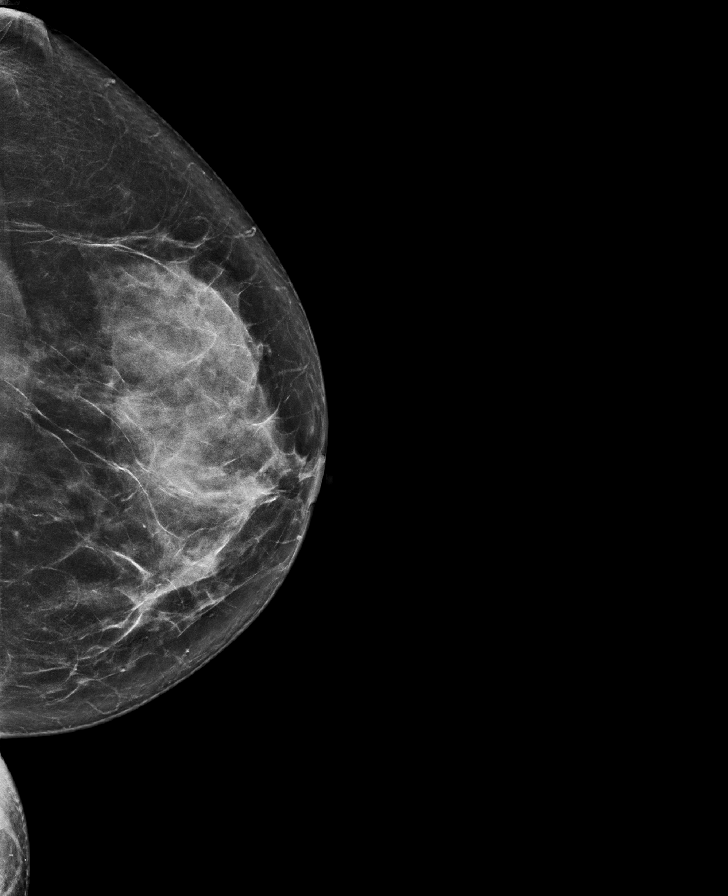

[L MLO synth-2D]
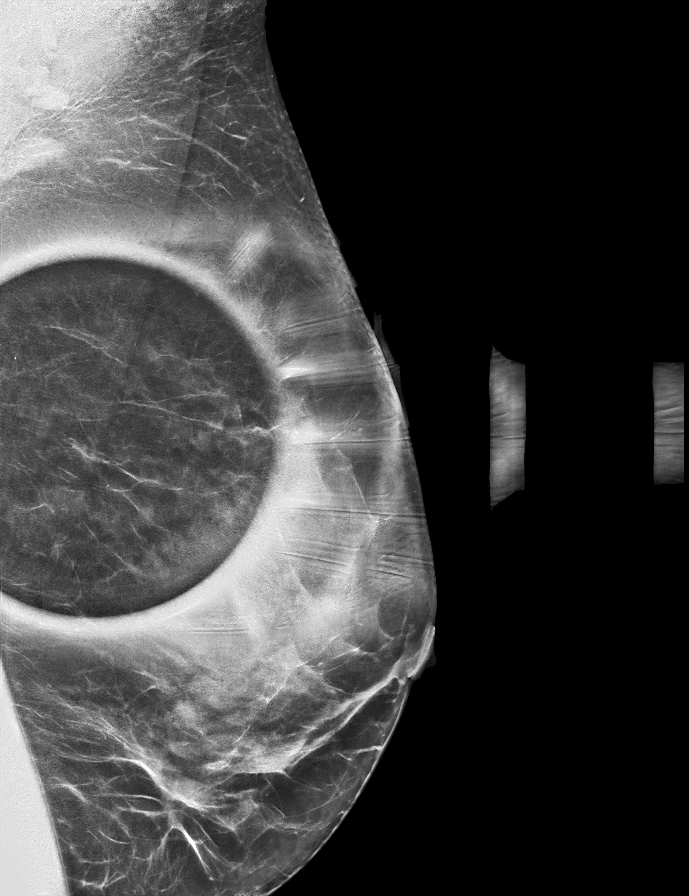

[L XCCL synth-2D (2 of 2)]
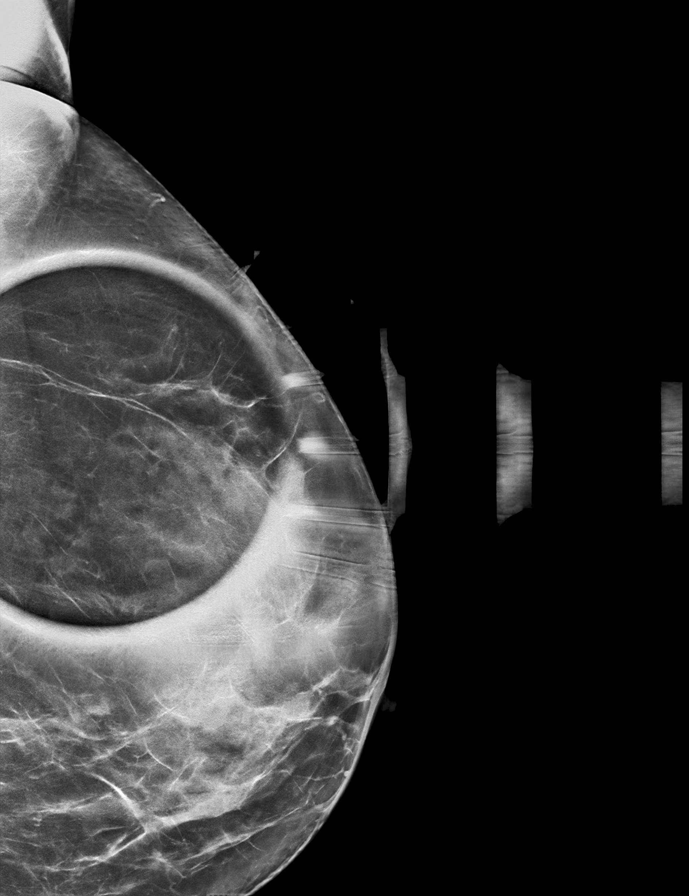

[L LM synth-2D]
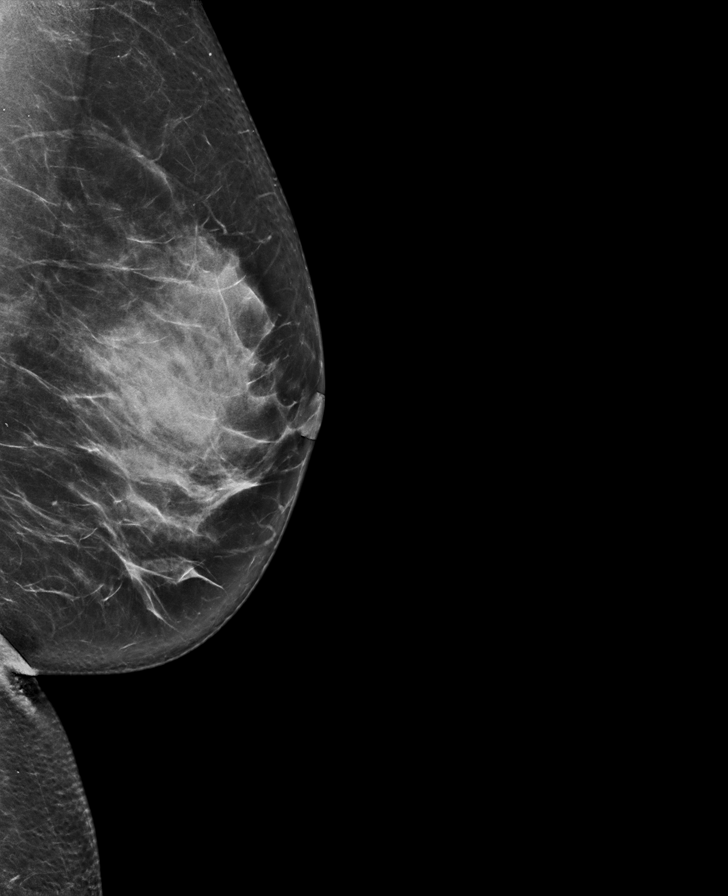

[L MLO tomo · tomo slice 35/70.0]
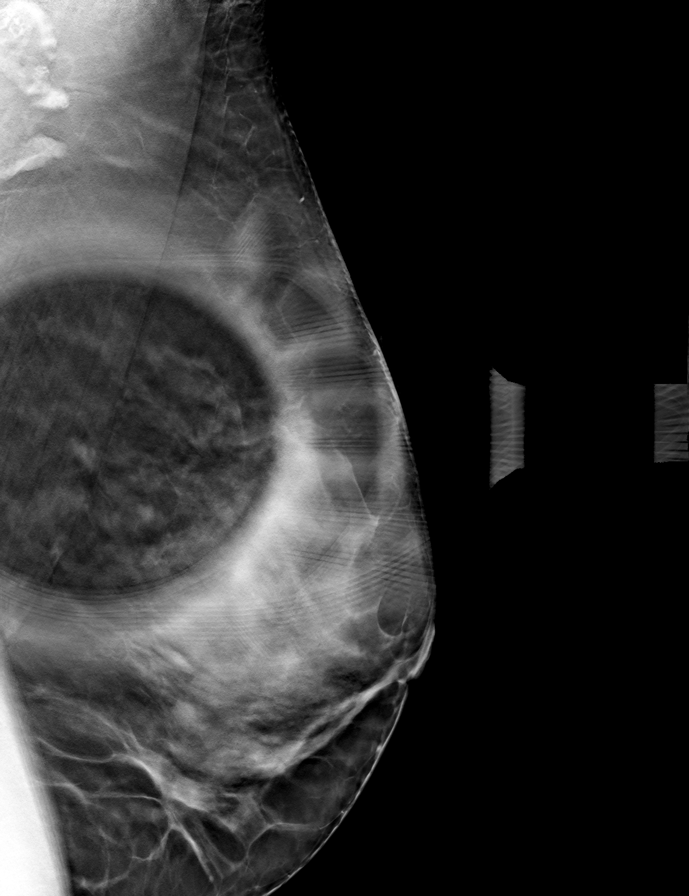

[L LM tomo · tomo slice 39/78.0]
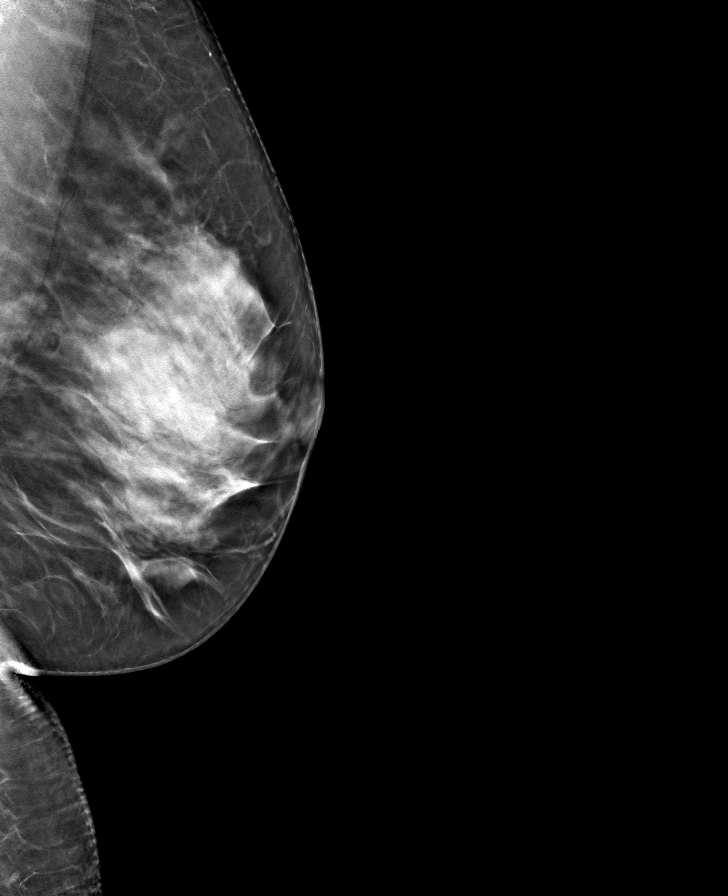

[L XCCL tomo (1 of 2) · tomo slice 39/78.0]
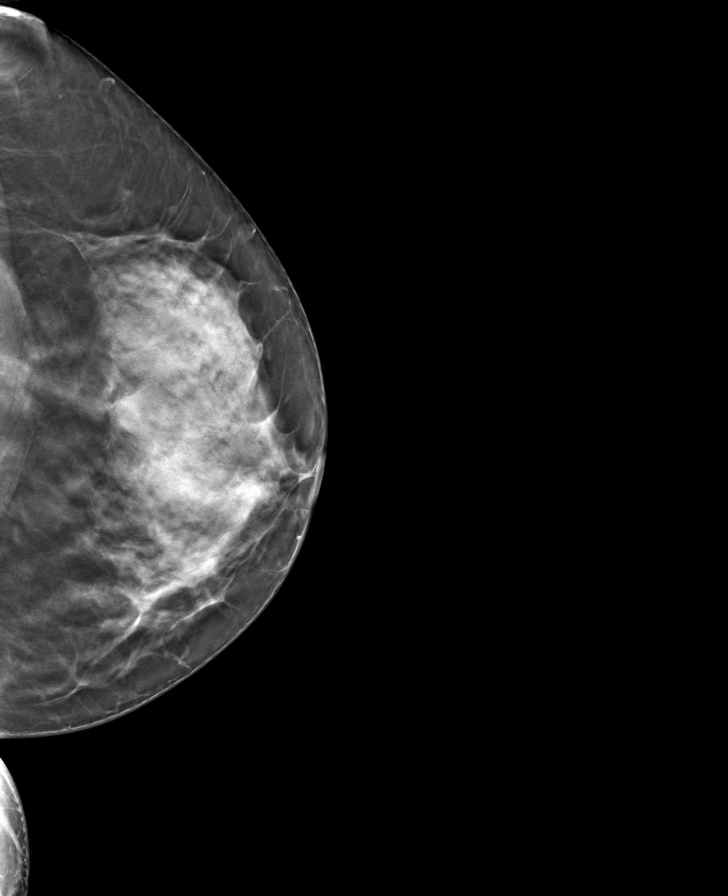

[L XCCL tomo (2 of 2) · tomo slice 38/75.0]
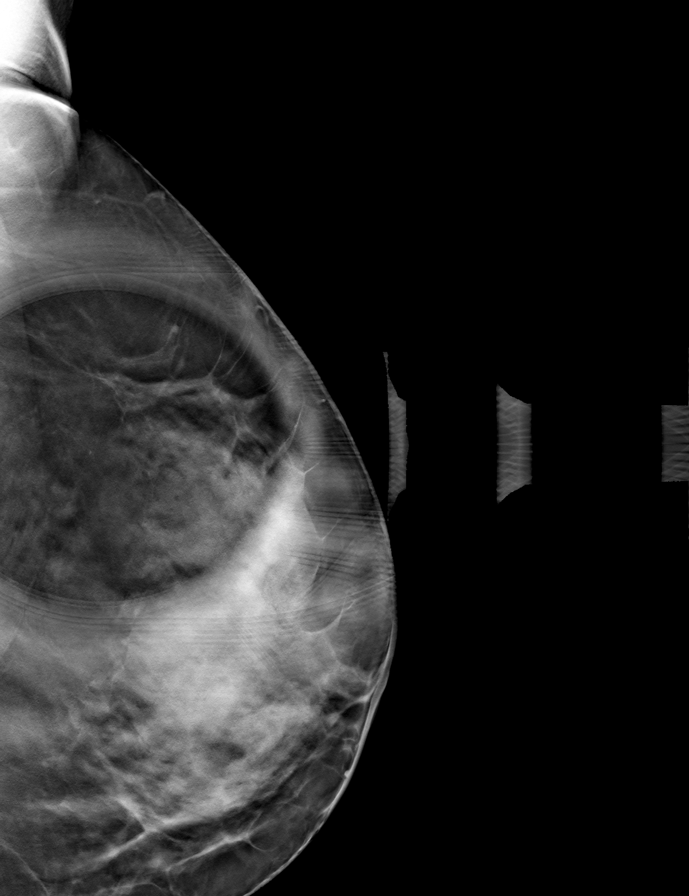

[8 of 24 positions shown; findings below may reference images not displayed]

ACR Breast Density Category c: The breast tissue is heterogeneously
dense, which may obscure small masses.
FINDINGS: There is a persistent focal asymmetry within the upper-outer left
breast far posterior depth. On additional imaging, there is
suggestion of fatty density surrounding the asymmetry, raising the
possibility of hamartoma.

Mammographic images were processed with CAD.

Targeted ultrasound is performed, showing a 1.8 x 0.7 x 1.5 cm mixed
echogenicity mass left breast 3 o'clock position 7 cm from nipple,
felt to correspond with mammographically identified mass.
IMPRESSION: Mass within the posterior left breast favored to represent hamartoma
based on mammographic characteristics. Given that this is difficult
to visualize and completely confirm on ultrasound, a follow-up is
recommended.

RECOMMENDATION:
Left breast diagnostic mammogram and possible ultrasound in 3 months
to reassess suspected hamartoma within the left breast.

I have discussed the findings and recommendations with the patient.
If applicable, a reminder letter will be sent to the patient
regarding the next appointment.

BI-RADS CATEGORY  3: Probably benign.

## 2022-06-30 ENCOUNTER — Encounter: Payer: Self-pay | Admitting: Physician Assistant

## 2022-06-30 ENCOUNTER — Ambulatory Visit: Payer: Self-pay

## 2022-06-30 ENCOUNTER — Ambulatory Visit: Payer: Self-pay | Admitting: Physician Assistant

## 2022-06-30 VITALS — BP 124/81 | HR 74 | Temp 97.7°F | Resp 14 | Ht 64.0 in | Wt 150.0 lb

## 2022-06-30 DIAGNOSIS — Z021 Encounter for pre-employment examination: Secondary | ICD-10-CM

## 2022-06-30 LAB — POCT URINALYSIS DIPSTICK
Bilirubin, UA: NEGATIVE
Blood, UA: NEGATIVE
Glucose, UA: NEGATIVE
Ketones, UA: NEGATIVE
Leukocytes, UA: NEGATIVE
Nitrite, UA: NEGATIVE
Protein, UA: NEGATIVE
Spec Grav, UA: 1.015 (ref 1.010–1.025)
Urobilinogen, UA: 0.2 E.U./dL
pH, UA: 7 (ref 5.0–8.0)

## 2022-06-30 NOTE — Progress Notes (Signed)
Pt presents today to complete [employer] pre-employment physical. Pt denies any issues or concerns./CL,RMA 

## 2022-06-30 NOTE — Progress Notes (Signed)
[facility]  ____________________________________________   None    (approximate)  I have reviewed the triage vital signs and the nursing notes.   HISTORY  Chief Complaint Employment Physical ([place])   HPI Crystal Cohen is a 46 y.o. female patient presents for preemployment physical examination for the [employer].         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   TONSILLECTOMY  as child    Prior to Admission medications   Not on File    Allergies Patient has no known allergies.  Family History  Problem Relation Age of Onset   Breast cancer Paternal Grandmother        double mastectomy   Heart disease Father     Social History Social History   Tobacco Use   Smoking status: Former    Packs/day: 0.50    Years: 15.00    Total pack years: 7.50    Types: Cigarettes    Quit date: 10/13/2021    Years since quitting: 0.7   Smokeless tobacco: Never  Vaping Use   Vaping Use: Every day  Substance Use Topics   Alcohol use: No   Drug use: No    Review of Systems Constitutional: No fever/chills Eyes: No visual changes. ENT: No sore throat. Cardiovascular: Denies chest pain. Respiratory: Denies shortness of  breath. Gastrointestinal: No abdominal pain.  No nausea, no vomiting.  No diarrhea.  No constipation. Genitourinary: Negative for dysuria. Musculoskeletal: Negative for back pain. Skin: Negative for rash. Neurological: Negative for headaches, focal weakness or numbness. Psychiatric: Anxiety  ____________________________________________   PHYSICAL EXAM:  VITAL SIGNS:  BP is 124/81, pulse 74, respiration 14, temp 97.7, patient 1% O2 sat on room air. 50 pounds BMI is 25.75. Constitutional: Alert and oriented. Well appearing and in no acute distress. Eyes: Conjunctivae are normal. PERRL. EOMI. Head: Atraumatic. Nose: No congestion/rhinnorhea. Mouth/Throat: Mucous membranes are moist.  Oropharynx non-erythematous. Neck: No stridor.  No cervical spine tenderness to palpation. Hematological/Lymphatic/Immunilogical: No cervical lymphadenopathy. Cardiovascular: Normal rate, regular rhythm. Grossly normal heart sounds.  Good peripheral circulation. Respiratory: Normal respiratory effort.  No retractions. Lungs CTAB. Gastrointestinal: Soft and nontender. No distention. No abdominal bruits. No CVA tenderness. Genitourinary: Deferred Musculoskeletal: No lower extremity tenderness nor edema.  No joint effusions. Neurologic:  Normal speech and language. No gross focal neurologic deficits are appreciated. No gait instability. Skin:  Skin is warm, dry and intact. No rash noted. Psychiatric: Mood and affect are normal. Speech and behavior are normal.  ____________________________________________   LABS      Component Ref Range & Units 14:27 3 yr ago  Color, UA  Yellow    Clarity, UA  Clear    Glucose, UA Negative Negative  Bilirubin, UA  Negative    Ketones, UA  Negative    Spec Grav, UA 1.010 - 1.025 1.015    Blood, UA  Negative    pH, UA 5.0 - 8.0 7.0    Protein, UA Negative Negative    Urobilinogen, UA 0.2 or 1.0 E.U./dL 0.2    Nitrite, UA  Negative    Leukocytes, UA Negative  Negative    Appearance   CLEAR Abnormal  R            ______ Labs pending.  ______________________________________    ____________________________________________   INITIAL IMPRESSION / ASSESSMENT AND PLAN  As part of my medical decision making, I reviewed the following data within the electronic MEDICAL RECORD NUMBER       Vitals no abnormal findings on physical exam.  Labs are pending.      ____________________________________________   FINAL CLINICAL IMPRESSION  Well exam   ED Discharge Orders     None        Note:  This document was prepared using Dragon voice recognition software and may include unintentional dictation errors.

## 2022-06-30 NOTE — Progress Notes (Signed)
Pt presents today to complete [employer] pre-employment physical./CL,RMA

## 2022-07-02 LAB — CMP12+LP+TP+TSH+6AC+CBC/D/PLT
ALT: 19 IU/L (ref 0–32)
AST: 22 IU/L (ref 0–40)
Albumin/Globulin Ratio: 2.2 (ref 1.2–2.2)
Albumin: 4.7 g/dL (ref 3.9–4.9)
Alkaline Phosphatase: 61 IU/L (ref 44–121)
BUN/Creatinine Ratio: 21 (ref 9–23)
BUN: 16 mg/dL (ref 6–24)
Basophils Absolute: 0.1 10*3/uL (ref 0.0–0.2)
Basos: 1 %
Bilirubin Total: 0.3 mg/dL (ref 0.0–1.2)
Calcium: 9.3 mg/dL (ref 8.7–10.2)
Chloride: 102 mmol/L (ref 96–106)
Chol/HDL Ratio: 4.6 ratio — ABNORMAL HIGH (ref 0.0–4.4)
Cholesterol, Total: 219 mg/dL — ABNORMAL HIGH (ref 100–199)
Creatinine, Ser: 0.76 mg/dL (ref 0.57–1.00)
EOS (ABSOLUTE): 0.3 10*3/uL (ref 0.0–0.4)
Eos: 5 %
Estimated CHD Risk: 1.1 times avg. — ABNORMAL HIGH (ref 0.0–1.0)
Free Thyroxine Index: 1.7 (ref 1.2–4.9)
GGT: 12 IU/L (ref 0–60)
Globulin, Total: 2.1 g/dL (ref 1.5–4.5)
Glucose: 96 mg/dL (ref 70–99)
HDL: 48 mg/dL (ref >39)
Hematocrit: 39 % (ref 34.0–46.6)
Hemoglobin: 12.8 g/dL (ref 11.1–15.9)
Immature Grans (Abs): 0.1 10*3/uL (ref 0.0–0.1)
Immature Granulocytes: 1 %
Iron: 48 ug/dL (ref 27–159)
LDH: 156 IU/L (ref 119–226)
LDL Chol Calc (NIH): 149 mg/dL — ABNORMAL HIGH (ref 0–99)
Lymphocytes Absolute: 2 10*3/uL (ref 0.7–3.1)
Lymphs: 34 %
MCH: 27.5 pg (ref 26.6–33.0)
MCHC: 32.8 g/dL (ref 31.5–35.7)
MCV: 84 fL (ref 79–97)
Monocytes Absolute: 0.4 10*3/uL (ref 0.1–0.9)
Monocytes: 7 %
Neutrophils Absolute: 3.2 10*3/uL (ref 1.4–7.0)
Neutrophils: 52 %
Phosphorus: 3 mg/dL (ref 3.0–4.3)
Platelets: 294 10*3/uL (ref 150–450)
Potassium: 3.9 mmol/L (ref 3.5–5.2)
RBC: 4.65 x10E6/uL (ref 3.77–5.28)
RDW: 13.8 % (ref 11.7–15.4)
Sodium: 145 mmol/L — ABNORMAL HIGH (ref 134–144)
T3 Uptake Ratio: 24 % (ref 24–39)
T4, Total: 7.2 ug/dL (ref 4.5–12.0)
TSH: 1.04 u[IU]/mL (ref 0.450–4.500)
Total Protein: 6.8 g/dL (ref 6.0–8.5)
Triglycerides: 124 mg/dL (ref 0–149)
Uric Acid: 5.6 mg/dL (ref 2.6–6.2)
VLDL Cholesterol Cal: 22 mg/dL (ref 5–40)
WBC: 6.1 10*3/uL (ref 3.4–10.8)
eGFR: 98 mL/min/{1.73_m2} (ref >59)

## 2022-12-12 ENCOUNTER — Emergency Department: Payer: Self-pay

## 2022-12-12 ENCOUNTER — Encounter
Admission: EM | Disposition: A | Discharge status: Home / Self Care | Payer: Self-pay | Source: Home / Self Care | Attending: Emergency Medicine

## 2022-12-12 ENCOUNTER — Observation Stay
Admission: EM | Admit: 2022-12-12 | Discharge: 2022-12-13 | Disposition: A | Discharge status: Home / Self Care | Payer: Self-pay | Attending: General Surgery | Admitting: General Surgery

## 2022-12-12 ENCOUNTER — Observation Stay: Payer: Self-pay | Admitting: Certified Registered"

## 2022-12-12 ENCOUNTER — Other Ambulatory Visit: Payer: Self-pay

## 2022-12-12 DIAGNOSIS — Z87891 Personal history of nicotine dependence: Secondary | ICD-10-CM | POA: Insufficient documentation

## 2022-12-12 DIAGNOSIS — K810 Acute cholecystitis: Secondary | ICD-10-CM | POA: Diagnosis present

## 2022-12-12 DIAGNOSIS — R1011 Right upper quadrant pain: Secondary | ICD-10-CM | POA: Insufficient documentation

## 2022-12-12 DIAGNOSIS — K8012 Calculus of gallbladder with acute and chronic cholecystitis without obstruction: Principal | ICD-10-CM | POA: Insufficient documentation

## 2022-12-12 LAB — COMPREHENSIVE METABOLIC PANEL
ALT: 71 U/L — ABNORMAL HIGH (ref 0–44)
AST: 138 U/L — ABNORMAL HIGH (ref 15–41)
Albumin: 4.4 g/dL (ref 3.5–5.0)
Alkaline Phosphatase: 49 U/L (ref 38–126)
Anion gap: 11 (ref 5–15)
BUN: 15 mg/dL (ref 6–20)
CO2: 28 mmol/L (ref 22–32)
Calcium: 9.5 mg/dL (ref 8.9–10.3)
Chloride: 103 mmol/L (ref 98–111)
Creatinine, Ser: 0.81 mg/dL (ref 0.44–1.00)
GFR, Estimated: >60 mL/min (ref >60)
Glucose, Bld: 141 mg/dL — ABNORMAL HIGH (ref 70–99)
Potassium: 3.9 mmol/L (ref 3.5–5.1)
Sodium: 142 mmol/L (ref 135–145)
Total Bilirubin: 0.7 mg/dL (ref 0.3–1.2)
Total Protein: 7.4 g/dL (ref 6.5–8.1)

## 2022-12-12 LAB — LIPASE, BLOOD: Lipase: 43 U/L (ref 11–51)

## 2022-12-12 LAB — CBC
HCT: 40.7 % (ref 36.0–46.0)
Hemoglobin: 13.1 g/dL (ref 12.0–15.0)
MCH: 27 pg (ref 26.0–34.0)
MCHC: 32.2 g/dL (ref 30.0–36.0)
MCV: 83.9 fL (ref 80.0–100.0)
Platelets: 281 10*3/uL (ref 150–400)
RBC: 4.85 MIL/uL (ref 3.87–5.11)
RDW: 12.4 % (ref 11.5–15.5)
WBC: 11.6 10*3/uL — ABNORMAL HIGH (ref 4.0–10.5)
nRBC: 0 % (ref 0.0–0.2)

## 2022-12-12 LAB — POC URINE PREG, ED: Preg Test, Ur: NEGATIVE

## 2022-12-12 LAB — TROPONIN I (HIGH SENSITIVITY)
Troponin I (High Sensitivity): <2 ng/L (ref <18)
Troponin I (High Sensitivity): 3 ng/L (ref <18)

## 2022-12-12 SURGERY — CHOLECYSTECTOMY, ROBOT-ASSISTED, LAPAROSCOPIC
Anesthesia: General

## 2022-12-12 MED ORDER — ONDANSETRON HCL 4 MG/2ML IJ SOLN
4.0000 mg | Freq: Once | INTRAMUSCULAR | Status: AC
  Administered 2022-12-12: 4 mg via INTRAVENOUS
  Filled 2022-12-12: qty 2

## 2022-12-12 MED ORDER — FENTANYL CITRATE (PF) 100 MCG/2ML IJ SOLN
INTRAMUSCULAR | Status: DC | PRN
  Administered 2022-12-12 (×2): 50 ug via INTRAVENOUS

## 2022-12-12 MED ORDER — HYDROMORPHONE HCL 1 MG/ML IJ SOLN: 0.2500 mg | INTRAMUSCULAR | Status: DC | PRN

## 2022-12-12 MED ORDER — KETOROLAC TROMETHAMINE 30 MG/ML IJ SOLN
INTRAMUSCULAR | Status: DC | PRN
  Administered 2022-12-12: 30 mg via INTRAVENOUS

## 2022-12-12 MED ORDER — SODIUM CHLORIDE 0.9 % IV SOLN: INTRAVENOUS | Status: DC | PRN

## 2022-12-12 MED ORDER — ORAL CARE MOUTH RINSE: 15.0000 mL | OROMUCOSAL | Status: DC | PRN

## 2022-12-12 MED ORDER — MORPHINE SULFATE (PF) 4 MG/ML IV SOLN
4.0000 mg | INTRAVENOUS | Status: DC | PRN
  Administered 2022-12-12 (×2): 4 mg via INTRAVENOUS
  Filled 2022-12-12 (×2): qty 1

## 2022-12-12 MED ORDER — KETAMINE HCL 10 MG/ML IJ SOLN
INTRAMUSCULAR | Status: DC | PRN
  Administered 2022-12-12: 10 mg via INTRAVENOUS
  Administered 2022-12-12: 30 mg via INTRAVENOUS
  Administered 2022-12-12: 10 mg via INTRAVENOUS

## 2022-12-12 MED ORDER — OXYCODONE HCL 5 MG/5ML PO SOLN: 5.0000 mg | Freq: Once | ORAL | Status: DC | PRN

## 2022-12-12 MED ORDER — KETOROLAC TROMETHAMINE 30 MG/ML IJ SOLN
INTRAMUSCULAR | Status: AC
  Filled 2022-12-12: qty 2

## 2022-12-12 MED ORDER — LIDOCAINE HCL (CARDIAC) PF 100 MG/5ML IV SOSY
PREFILLED_SYRINGE | INTRAVENOUS | Status: DC | PRN
  Administered 2022-12-12: 80 mg via INTRAVENOUS

## 2022-12-12 MED ORDER — FENTANYL CITRATE (PF) 100 MCG/2ML IJ SOLN
INTRAMUSCULAR | Status: AC
  Filled 2022-12-12: qty 2

## 2022-12-12 MED ORDER — PIPERACILLIN-TAZOBACTAM 3.375 G IVPB
INTRAVENOUS | Status: AC
  Filled 2022-12-12: qty 50

## 2022-12-12 MED ORDER — OXYCODONE HCL 5 MG PO TABS: 5.0000 mg | ORAL_TABLET | Freq: Once | ORAL | Status: DC | PRN

## 2022-12-12 MED ORDER — MIDAZOLAM HCL 2 MG/2ML IJ SOLN
INTRAMUSCULAR | Status: AC
  Filled 2022-12-12: qty 2

## 2022-12-12 MED ORDER — KETAMINE HCL 50 MG/5ML IJ SOSY
PREFILLED_SYRINGE | INTRAMUSCULAR | Status: AC
  Filled 2022-12-12: qty 5

## 2022-12-12 MED ORDER — METOCLOPRAMIDE HCL 5 MG/ML IJ SOLN
INTRAMUSCULAR | Status: AC
  Filled 2022-12-12: qty 2

## 2022-12-12 MED ORDER — VASOPRESSIN 20 UNIT/ML IV SOLN
INTRAVENOUS | Status: DC | PRN
  Administered 2022-12-12 (×2): 1 [IU] via INTRAVENOUS

## 2022-12-12 MED ORDER — EPHEDRINE SULFATE (PRESSORS) 50 MG/ML IJ SOLN
INTRAMUSCULAR | Status: DC | PRN
  Administered 2022-12-12: 10 mg via INTRAVENOUS

## 2022-12-12 MED ORDER — DEXMEDETOMIDINE HCL IN NACL 80 MCG/20ML IV SOLN
INTRAVENOUS | Status: DC | PRN
  Administered 2022-12-12 (×3): 4 ug via BUCCAL
  Administered 2022-12-12: 8 ug via BUCCAL

## 2022-12-12 MED ORDER — INDOCYANINE GREEN 25 MG IV SOLR
1.2500 mg | Freq: Once | INTRAVENOUS | Status: AC
  Administered 2022-12-12: 1.25 mg via INTRAVENOUS
  Filled 2022-12-12: qty 0.5

## 2022-12-12 MED ORDER — HYDROCODONE-ACETAMINOPHEN 5-325 MG PO TABS
1.0000 | ORAL_TABLET | ORAL | Status: DC | PRN
  Administered 2022-12-13 (×2): 1 via ORAL
  Filled 2022-12-12: qty 2
  Filled 2022-12-12: qty 1

## 2022-12-12 MED ORDER — PIPERACILLIN-TAZOBACTAM 3.375 G IVPB 30 MIN
3.3750 g | Freq: Once | INTRAVENOUS | Status: AC
  Administered 2022-12-12: 3.375 g via INTRAVENOUS
  Filled 2022-12-12: qty 50

## 2022-12-12 MED ORDER — ONDANSETRON HCL 4 MG/2ML IJ SOLN
INTRAMUSCULAR | Status: DC | PRN
  Administered 2022-12-12: 4 mg via INTRAMUSCULAR

## 2022-12-12 MED ORDER — ACETAMINOPHEN 650 MG RE SUPP: 650.0000 mg | Freq: Four times a day (QID) | RECTAL | Status: DC | PRN

## 2022-12-12 MED ORDER — PIPERACILLIN-TAZOBACTAM 3.375 G IVPB
3.3750 g | Freq: Three times a day (TID) | INTRAVENOUS | Status: DC
  Administered 2022-12-12 – 2022-12-13 (×3): 3.375 g via INTRAVENOUS
  Filled 2022-12-12 (×2): qty 50

## 2022-12-12 MED ORDER — MIDAZOLAM HCL 2 MG/2ML IJ SOLN
INTRAMUSCULAR | Status: DC | PRN
  Administered 2022-12-12 (×2): 1 mg via INTRAVENOUS

## 2022-12-12 MED ORDER — ONDANSETRON HCL 4 MG/2ML IJ SOLN
INTRAMUSCULAR | Status: AC
  Administered 2022-12-13: 4 mg via INTRAVENOUS
  Filled 2022-12-12: qty 2

## 2022-12-12 MED ORDER — PROPOFOL 10 MG/ML IV BOLUS
INTRAVENOUS | Status: DC | PRN
  Administered 2022-12-12: 150 ug/kg/min via INTRAVENOUS
  Administered 2022-12-12: 150 mg via INTRAVENOUS

## 2022-12-12 MED ORDER — SODIUM CHLORIDE 0.9 % IV SOLN: INTRAVENOUS | Status: DC

## 2022-12-12 MED ORDER — SODIUM CHLORIDE 0.9 % IV BOLUS
1000.0000 mL | Freq: Once | INTRAVENOUS | Status: AC
  Administered 2022-12-12: 1000 mL via INTRAVENOUS

## 2022-12-12 MED ORDER — DEXAMETHASONE SODIUM PHOSPHATE 10 MG/ML IJ SOLN
INTRAMUSCULAR | Status: DC | PRN
  Administered 2022-12-12: 10 mg via INTRAVENOUS

## 2022-12-12 MED ORDER — ENOXAPARIN SODIUM 40 MG/0.4ML IJ SOSY
40.0000 mg | PREFILLED_SYRINGE | INTRAMUSCULAR | Status: DC
  Administered 2022-12-12 – 2022-12-13 (×2): 40 mg via SUBCUTANEOUS
  Filled 2022-12-12 (×2): qty 0.4

## 2022-12-12 MED ORDER — ONDANSETRON 4 MG PO TBDP: 4.0000 mg | ORAL_TABLET | Freq: Four times a day (QID) | ORAL | Status: DC | PRN

## 2022-12-12 MED ORDER — SUCCINYLCHOLINE CHLORIDE 200 MG/10ML IV SOSY
PREFILLED_SYRINGE | INTRAVENOUS | Status: DC | PRN
  Administered 2022-12-12: 100 mg via INTRAVENOUS

## 2022-12-12 MED ORDER — ACETAMINOPHEN 325 MG PO TABS: 650.0000 mg | ORAL_TABLET | Freq: Four times a day (QID) | ORAL | Status: DC | PRN

## 2022-12-12 MED ORDER — MORPHINE SULFATE (PF) 4 MG/ML IV SOLN
4.0000 mg | Freq: Once | INTRAVENOUS | Status: AC
  Administered 2022-12-12: 4 mg via INTRAVENOUS
  Filled 2022-12-12: qty 1

## 2022-12-12 MED ORDER — ONDANSETRON HCL 4 MG/2ML IJ SOLN
4.0000 mg | Freq: Once | INTRAMUSCULAR | Status: AC
  Administered 2022-12-12: 4 mg via INTRAVENOUS

## 2022-12-12 MED ORDER — METOCLOPRAMIDE HCL 5 MG/ML IJ SOLN
INTRAMUSCULAR | Status: DC | PRN
  Administered 2022-12-12: 5 mg via INTRAVENOUS

## 2022-12-12 MED ORDER — ROCURONIUM BROMIDE 100 MG/10ML IV SOLN
INTRAVENOUS | Status: DC | PRN
  Administered 2022-12-12: 30 mg via INTRAVENOUS

## 2022-12-12 MED ORDER — ONDANSETRON HCL 4 MG/2ML IJ SOLN
4.0000 mg | Freq: Four times a day (QID) | INTRAMUSCULAR | Status: DC | PRN
  Administered 2022-12-12: 4 mg via INTRAVENOUS
  Filled 2022-12-12 (×2): qty 2

## 2022-12-12 MED ORDER — SUGAMMADEX SODIUM 200 MG/2ML IV SOLN
INTRAVENOUS | Status: DC | PRN
  Administered 2022-12-12: 200 mg via INTRAVENOUS

## 2022-12-12 MED ORDER — BUPIVACAINE HCL (PF) 0.25 % IJ SOLN
INTRAMUSCULAR | Status: AC
  Filled 2022-12-12: qty 30

## 2022-12-12 MED ORDER — BUPIVACAINE HCL 0.25 % IJ SOLN
INTRAMUSCULAR | Status: DC | PRN
  Administered 2022-12-12: 30 mL

## 2022-12-12 SURGICAL SUPPLY — 54 items
BAG PRESSURE INF REUSE 1000 (BAG) IMPLANT
BLADE SURG SZ11 CARB STEEL (BLADE) ×1 IMPLANT
CANNULA REDUC XI 12-8 STAPL (CANNULA) ×1
CANNULA REDUCER 12-8 DVNC XI (CANNULA) ×1 IMPLANT
CATH REDDICK CHOLANGI 4FR 50CM (CATHETERS) IMPLANT
CLIP LIGATING HEM O LOK PURPLE (MISCELLANEOUS) IMPLANT
CLIP LIGATING HEMO O LOK GREEN (MISCELLANEOUS) ×1 IMPLANT
DERMABOND ADVANCED .7 DNX12 (GAUZE/BANDAGES/DRESSINGS) ×1 IMPLANT
DRAPE ARM DVNC X/XI (DISPOSABLE) ×4 IMPLANT
DRAPE C-ARM XRAY 36X54 (DRAPES) IMPLANT
DRAPE COLUMN DVNC XI (DISPOSABLE) ×1 IMPLANT
DRAPE DA VINCI XI ARM (DISPOSABLE) ×4
DRAPE DA VINCI XI COLUMN (DISPOSABLE) ×1
ELECT REM PT RETURN 9FT ADLT (ELECTROSURGICAL) ×1
ELECTRODE REM PT RTRN 9FT ADLT (ELECTROSURGICAL) ×1 IMPLANT
GLOVE BIO SURGEON STRL SZ 6.5 (GLOVE) ×2 IMPLANT
GLOVE BIOGEL PI IND STRL 6.5 (GLOVE) ×2 IMPLANT
GOWN STRL REUS W/ TWL LRG LVL3 (GOWN DISPOSABLE) ×3 IMPLANT
GOWN STRL REUS W/TWL LRG LVL3 (GOWN DISPOSABLE) ×3
GRASPER SUT TROCAR 14GX15 (MISCELLANEOUS) ×1 IMPLANT
IRRIGATOR SUCT 8 DISP DVNC XI (IRRIGATION / IRRIGATOR) IMPLANT
IRRIGATOR SUCTION 8MM XI DISP (IRRIGATION / IRRIGATOR)
IV CATH ANGIO 12GX3 LT BLUE (NEEDLE) IMPLANT
IV NS 1000ML (IV SOLUTION)
IV NS 1000ML BAXH (IV SOLUTION) IMPLANT
KIT PINK PAD W/HEAD ARE REST (MISCELLANEOUS) ×1 IMPLANT
KIT PINK PAD W/HEAD ARM REST (MISCELLANEOUS) ×1 IMPLANT
LABEL OR SOLS (LABEL) ×1 IMPLANT
MANIFOLD NEPTUNE II (INSTRUMENTS) ×1 IMPLANT
NDL INSUFFLATION 14GA 120MM (NEEDLE) ×1 IMPLANT
NEEDLE HYPO 22GX1.5 SAFETY (NEEDLE) ×1 IMPLANT
NEEDLE INSUFFLATION 14GA 120MM (NEEDLE) ×1 IMPLANT
NS IRRIG 500ML POUR BTL (IV SOLUTION) ×1 IMPLANT
OBTURATOR OPTICAL STANDARD 8MM (TROCAR) ×1
OBTURATOR OPTICAL STND 8 DVNC (TROCAR) ×1
OBTURATOR OPTICALSTD 8 DVNC (TROCAR) ×1 IMPLANT
PACK LAP CHOLECYSTECTOMY (MISCELLANEOUS) ×1 IMPLANT
SEAL CANN UNIV 5-8 DVNC XI (MISCELLANEOUS) ×3 IMPLANT
SEAL XI 5MM-8MM UNIVERSAL (MISCELLANEOUS) ×3
SET TUBE SMOKE EVAC HIGH FLOW (TUBING) ×1 IMPLANT
SOL ELECTROSURG ANTI STICK (MISCELLANEOUS) ×1
SOLUTION ELECTROSURG ANTI STCK (MISCELLANEOUS) ×1 IMPLANT
SPIKE FLUID TRANSFER (MISCELLANEOUS) ×2 IMPLANT
SPONGE T-LAP 4X18 ~~LOC~~+RFID (SPONGE) IMPLANT
STAPLER CANNULA SEAL DVNC XI (STAPLE) ×1 IMPLANT
STAPLER CANNULA SEAL XI (STAPLE) ×1
SUT MNCRL 4-0 (SUTURE) ×1
SUT MNCRL 4-0 27XMFL (SUTURE) ×1
SUT VICRYL 0 UR6 27IN ABS (SUTURE) ×1 IMPLANT
SUTURE MNCRL 4-0 27XMF (SUTURE) ×1 IMPLANT
SYS BAG RETRIEVAL 10MM (BASKET) ×1
SYSTEM BAG RETRIEVAL 10MM (BASKET) ×1 IMPLANT
TRAP FLUID SMOKE EVACUATOR (MISCELLANEOUS) ×1 IMPLANT
WATER STERILE IRR 500ML POUR (IV SOLUTION) ×1 IMPLANT

## 2022-12-12 NOTE — H&P (Signed)
SURGICAL CONSULTATION NOTE   HISTORY OF PRESENT ILLNESS (HPI):  47 y.o. female presented to [facility] ED for evaluation of epigastric pain. Patient reports she has been having epigastric pain for the last 2 days.  She endorses the pain is epigastric.  No pain radiation.  She cannot identify any alleviating or aggravating factors.  During the last 2 days the pain has been intensifying.  Today the patient is with pain 10 out of 10.  At the ED she was found with mild leukocytosis and elevated liver enzymes.  Normal bilirubin.  She had ultrasound that shows cholelithiasis with gallbladder wall thickening.  She also had MRCP to rule out choledocholithiasis.  This shows pericholecystic fluid but no sign of choledocholithiasis.  I personally evaluated the images of the ultrasound and the MRCP.  Surgery is consulted by Dr. Kerman Passey in this context for evaluation and management of acute cholecystitis.  PAST MEDICAL HISTORY (PMH):  Past Medical History:  Diagnosis Date   Constipation 2011   Lump or mass in breast    left   Vaginal delivery 1993, 2002     PAST SURGICAL HISTORY ([place]):  Past Surgical History:  Procedure Laterality Date   CESAREAN SECTION  2011   SCAR REVISION N/A 04/25/2015   Procedure: Cesarean Section SCAR Mass/Nodule Excision;  Surgeon: Azucena Fallen, MD;  Location: [facility];  Service: Gynecology;  Laterality: N/A;  1 hr.   TONSILLECTOMY  as child     MEDICATIONS:  Prior to Admission medications   Not on File     ALLERGIES:  No Known Allergies   SOCIAL HISTORY:  Social History   Socioeconomic History   Marital status: Married    Spouse name: Not on file   Number of children: Not on file   Years of education: Not on file   Highest education level: Not on file  Occupational History   Not on file  Tobacco Use   Smoking status: Former    Packs/day: 0.50    Years: 15.00    Total pack years: 7.50    Types: Cigarettes    Quit date: 10/13/2021    Years since quitting: 1.1    Smokeless tobacco: Never  Vaping Use   Vaping Use: Every day  Substance and Sexual Activity   Alcohol use: No   Drug use: No   Sexual activity: Yes    Birth control/protection: None    Comment: tubal  Other Topics Concern   Not on file  Social History Narrative   Not on file   Social Determinants of Health   Financial Resource Strain: Not on file  Food Insecurity: Not on file  Transportation Needs: Not on file  Physical Activity: Not on file  Stress: Not on file  Social Connections: Not on file  Intimate Partner Violence: Not on file     FAMILY HISTORY:  Family History  Problem Relation Age of Onset   Breast cancer Paternal Grandmother        double mastectomy   Heart disease Father      REVIEW OF SYSTEMS:  Constitutional: denies weight loss, fever, chills, or sweats  Eyes: denies any other vision changes, history of eye injury  ENT: denies sore throat, hearing problems  Respiratory: denies shortness of breath, wheezing  Cardiovascular: denies chest pain, palpitations  Gastrointestinal: positive abdominal pain, nausea and vomitnig Genitourinary: denies burning with urination or urinary frequency Musculoskeletal: denies any other joint pains or cramps  Skin: denies any other rashes or skin discolorations  Neurological: denies any other headache, dizziness, weakness  Psychiatric: denies any other depression, anxiety   All other review of systems were negative   VITAL SIGNS:  Pulse Rate:  [61-75] 61 (03/01 0630) Resp:  [11-14] 14 (03/01 0630) BP: (107-119)/(54-63) 107/60 (03/01 0630) SpO2:  [98 %-100 %] 98 % (03/01 0630) Weight:  [65.8 kg] 65.8 kg (03/01 0500)     Height: '5\' 4"'$  (162.6 cm) Weight: 65.8 kg BMI (Calculated): 24.88   INTAKE/OUTPUT:  This shift: No intake/output data recorded.  Last 2 shifts: '@IOLAST2SHIFTS'$ @   PHYSICAL EXAM:  Constitutional:  -- Normal body habitus  -- Awake, alert, and oriented x3  Eyes:  -- Pupils equally round and  reactive to light  -- No scleral icterus  Ear, nose, and throat:  -- No jugular venous distension  Pulmonary:  -- No crackles  -- Equal breath sounds bilaterally -- Breathing non-labored at rest Cardiovascular:  -- S1, S2 present  -- No pericardial rubs Gastrointestinal:  -- Abdomen soft, tender to palpation in the right upper quadrant, non-distended, no guarding or rebound tenderness -- No abdominal masses appreciated, pulsatile or otherwise  Musculoskeletal and Integumentary:  -- Wounds: None appreciated -- Extremities: B/L UE and LE FROM, hands and feet warm, no edema  Neurologic:  -- Motor function: intact and symmetric -- Sensation: intact and symmetric   Labs:     Latest Ref Rng & Units 12/12/2022    5:32 AM 06/30/2022    2:11 PM 06/06/2019   10:09 AM  CBC  WBC 4.0 - 10.5 K/uL 11.6  6.1  7.6   Hemoglobin 12.0 - 15.0 g/dL 13.1  12.8  13.2   Hematocrit 36.0 - 46.0 % 40.7  39.0  40.8   Platelets 150 - 400 K/uL 281  294  286       Latest Ref Rng & Units 12/12/2022    5:32 AM 06/30/2022    2:11 PM 06/06/2019   10:09 AM  CMP  Glucose 70 - 99 mg/dL 141  96  106   BUN 6 - 20 mg/dL '15  16  14   '$ Creatinine 0.44 - 1.00 mg/dL 0.81  0.76  0.79   Sodium 135 - 145 mmol/L 142  145  142   Potassium 3.5 - 5.1 mmol/L 3.9  3.9  4.0   Chloride 98 - 111 mmol/L 103  102  108   CO2 22 - 32 mmol/L 28   25   Calcium 8.9 - 10.3 mg/dL 9.5  9.3  9.2   Total Protein 6.5 - 8.1 g/dL 7.4  6.8    Total Bilirubin 0.3 - 1.2 mg/dL 0.7  0.3    Alkaline Phos 38 - 126 U/L 49  61    AST 15 - 41 U/L 138  22    ALT 0 - 44 U/L 71  19      Imaging studies:  Narrative & Impression  CLINICAL DATA:  Right-sided chest and abdominal pain concern for choledocholithiasis.   EXAM: MRI ABDOMEN WITHOUT CONTRAST  (INCLUDING MRCP)   TECHNIQUE: Multiplanar multisequence MR imaging of the abdomen was performed. Heavily T2-weighted images of the biliary and pancreatic ducts were obtained, and three-dimensional  MRCP images were rendered by post processing.   COMPARISON:  Ultrasound December 12, 2022.   FINDINGS: Lower chest: No acute abnormality.   Hepatobiliary: No significant hepatic steatosis. Cholelithiasis in a distended gallbladder with gallbladder wall thickening and trace pericholecystic fluid. No biliary ductal dilation. No choledocholithiasis.   Pancreas:  No pancreatic ductal dilation or evidence of acute inflammation.   Spleen:  No splenomegaly.   Adrenals/Urinary Tract: Bilateral adrenal glands appear normal. No hydronephrosis.   Stomach/Bowel: Visualized portions within the abdomen are unremarkable.   Vascular/Lymphatic: Normal caliber abdominal aorta. Smooth IVC contours. No pathologically enlarged abdominal lymph nodes   Other:  No significant abdominal free fluid.   Musculoskeletal: No suspicious bone lesions identified.   IMPRESSION: 1. Cholelithiasis in a distended gallbladder with gallbladder wall thickening and trace pericholecystic fluid, compatible with acute cholecystitis. 2. No biliary ductal dilation or choledocholithiasis.    Assessment/Plan:  47 y.o. female with acute cholecystitis.  Patient with history, physical exam and images consistent with acute cholecystitis. Patient oriented about diagnosis and surgical management as treatment.   Discussed the risk of surgery including post-op infxn, seroma, biloma, chronic pain, poor-delayed wound healing, retained gallstone, conversion to open procedure, post-op SBO or ileus, and need for additional procedures to address said risks.  The risks of general anesthetic including MI, CVA, sudden death or even reaction to anesthetic medications also discussed. Alternatives include continued observation.  Benefits include possible symptom relief, prevention of complications including acute cholecystitis, pancreatitis.  Arnold Long, MD

## 2022-12-12 NOTE — ED Provider Notes (Signed)
[facility] Provider Note    Event Date/Time   First MD Initiated Contact with Patient 12/12/22 0502     (approximate)  History   Chief Complaint: Chest Pain and Emesis  HPI  Crystal Cohen is a 47 y.o. female with no significant past medical history presents to the emergency department for right upper quadrant/right chest abdominal pain.  According to the patient on Monday she developed pain in her right chest and right upper quadrant.  States the pain lasted several hours and then finally dissipated.  Patient has been pain-free until around 10 PM tonight when she developed acute onset of pain in the right lower chest/right upper quadrant this time radiating into her back on the right side as well.  States some radiation the pain into the middle of her chest and upper abdomen.  States nausea with several episodes of vomiting.  No diarrhea.  No fever.  Physical Exam   Triage Vital Signs: ED Triage Vitals  Enc Vitals Group     BP 12/12/22 0459 (!) 119/54     Pulse Rate 12/12/22 0459 75     Resp 12/12/22 0459 11     Temp --      Temp src --      SpO2 12/12/22 0459 99 %     Weight 12/12/22 0500 145 lb (65.8 kg)     Height 12/12/22 0500 '5\' 4"'$  (1.626 m)     Head Circumference --      Peak Flow --      Pain Score 12/12/22 0500 9     Pain Loc --      Pain Edu? --      Excl. in [place]? --     Most recent vital signs: Vitals:   12/12/22 0459  BP: (!) 119/54  Pulse: 75  Resp: 11  SpO2: 99%    General: Awake, no distress.  CV:  Good peripheral perfusion.  Regular rate and rhythm  Resp:  Normal effort.  Equal breath sounds bilaterally.  Abd:  No distention.  Soft, mild to moderate right upper quadrant epigastric tenderness palpation without rebound or guarding   ED Results / Procedures / Treatments   EKG  EKG viewed and interpreted by myself shows a normal sinus rhythm at 74 bpm with a narrow QRS, normal axis, normal intervals, no concerning ST  changes.  RADIOLOGY  Ultrasound read as cholelithiasis with mild gallbladder wall thickening positive Murphy sign, suspicious for acute cholecystitis, normal biliary duct.   MEDICATIONS ORDERED IN ED: Medications  morphine (PF) 4 MG/ML injection 4 mg (has no administration in time range)  ondansetron (ZOFRAN) injection 4 mg (has no administration in time range)  sodium chloride 0.9 % bolus 1,000 mL (has no administration in time range)     IMPRESSION / MDM / ASSESSMENT AND PLAN / ED COURSE  I reviewed the triage vital signs and the nursing notes.  Patient's presentation is most consistent with acute presentation with potential threat to life or bodily function.  Patient presents emergency department for right-sided chest pain/right upper quadrant abdominal pain intermittent since Monday severe tonight with nausea and vomiting.  Symptoms are suggestive of gallbladder pathology, differential would also include ACS, pancreatitis, gastritis, gastroenteritis.  We will check labs including LFTs as well as lipase and troponin.  Will obtain a right upper quadrant ultrasound.  We will IV hydrate treat pain and nausea medication while awaiting results.  Patient agreeable to plan of care.  Patient's  lab work shows a mild leukocytosis of 11,600 otherwise reassuring CBC, negative troponin.  Patient's chemistry however does show LFT elevation with a reassuringly normal total bilirubin lipase is normal.  Ultrasound confirms gallbladder wall thickening with cholelithiasis and positive Murphy sign suspicious for cholecystitis.  Will discuss with general surgery for admission.  We will start the patient on IV Zosyn while awaiting admission.  I spoke with Dr. Peyton Najjar of general surgery.  He would like an MRCP.  Patient care signed out to oncoming provider MRCP pending.  FINAL CLINICAL IMPRESSION(S) / ED DIAGNOSES   Right upper quadrant abdominal pain Acute cholecystitis  Note:  This document was prepared  using Dragon voice recognition software and may include unintentional dictation errors.   Harvest Dark, MD 12/12/22 [phone]

## 2022-12-12 NOTE — Transfer of Care (Signed)
Immediate Anesthesia Transfer of Care Note  Patient: Crystal Cohen  Procedure(s) Performed: XI ROBOTIC ASSISTED LAPAROSCOPIC CHOLECYSTECTOMY INDOCYANINE GREEN FLUORESCENCE IMAGING (ICG)  Patient Location: PACU  Anesthesia Type:General  Level of Consciousness: drowsy  Airway & Oxygen Therapy: Patient Spontanous Breathing and Patient connected to face mask oxygen  Post-op Assessment: Report given to RN and Post -op Vital signs reviewed and stable  Post vital signs: Reviewed and stable  Last Vitals:  Vitals Value Taken Time  BP 113/47 12/12/22 1536  Temp    Pulse 55 12/12/22 1540  Resp 13 12/12/22 1540  SpO2 100 % 12/12/22 1540  Vitals shown include unvalidated device data.  Last Pain:  Vitals:   12/12/22 1308  TempSrc: Temporal  PainSc: 0-No pain         Complications: No notable events documented.

## 2022-12-12 NOTE — Op Note (Signed)
Preoperative diagnosis: Acute cholecystitis   Postoperative diagnosis: Same  Procedure: Robotic Assisted Laparoscopic Cholecystectomy.   Anesthesia: GETA   Surgeon: Dr. Windell Moment  Wound Classification: Clean Contaminated  Indications: Patient is a 47 y.o. female developed right upper quadrant pain, nausea, vomiting, leukocytosis and on workup was found to have cholelithiasis with a normal common duct, pericholecystic fluid and gallbladder wall thickening consistent with cholecystitis. Robotic Assisted Laparoscopic cholecystectomy was elected.  Findings: Abundant pericholecystic edema Critical view of safety achieved Cystic duct and artery identified, ligated and divided Adequate hemostasis           Description of procedure: The patient was placed on the operating table in the supine position. General anesthesia was induced. A time-out was completed verifying correct patient, procedure, site, positioning, and implant(s) and/or special equipment prior to beginning this procedure. An orogastric tube was placed. The abdomen was prepped and draped in the usual sterile fashion.  An incision was made in a natural skin line below the umbilicus.  The fascia was elevated and the Veress needle inserted. Proper position was confirmed by aspiration and saline meniscus test.  The abdomen was insufflated with carbon dioxide to a pressure of 15 mmHg. The patient tolerated insufflation well. A 8-mm trocar was then inserted in optiview fashion.  The laparoscope was inserted and the abdomen inspected. No injuries from initial trocar placement were noted. Additional trocars were then inserted in the following locations: an 8-mm trocar in the left lateral abdomen, and another two 8-mm trocars to the right side of the abdomen 5 cm appart. The umbilical trocar was changed to a 12 mm trocar all under direct visualization. The abdomen was inspected and no abnormalities were found. The table was placed in  the reverse Trendelenburg position with the right side up. The robotic arms were docked and target anatomy identified. Instrument inserted under direct visualization.  Filmy adhesions between the gallbladder and omentum, duodenum and transverse colon were lysed with electrocautery. The dome of the gallbladder was grasped with a prograsp and retracted over the dome of the liver. The infundibulum was also grasped with an atraumatic grasper and retracted toward the right lower quadrant. This maneuver exposed Calot's triangle. The peritoneum overlying the gallbladder infundibulum was then incised and the cystic duct and cystic artery identified and circumferentially dissected. Critical view of safety reviewed before ligating any structure. Firefly images taken to visualize biliary ducts. The cystic duct and cystic artery were then doubly clipped and divided close to the gallbladder.  The gallbladder was then dissected from its peritoneal attachments by electrocautery. Hemostasis was checked and the gallbladder and contained stones were removed using an endoscopic retrieval bag. The gallbladder was passed off the table as a specimen. There was no evidence of bleeding from the gallbladder fossa or cystic artery or leakage of the bile from the cystic duct stump. Secondary trocars were removed under direct vision. No bleeding was noted. The robotic arms were undoked. The scope was withdrawn and the umbilical trocar removed. The abdomen was allowed to collapse. The fascia of the 22m trocar sites was closed with figure-of-eight 0 vicryl sutures. The skin was closed with subcuticular sutures of 4-0 monocryl and topical skin adhesive. The orogastric tube was removed.  The patient tolerated the procedure well and was taken to the postanesthesia care unit in stable condition.   Specimen: Gallbladder  Complications: None  EBL: 5 mL

## 2022-12-12 NOTE — Anesthesia Procedure Notes (Signed)
Procedure Name: Intubation Date/Time: 12/12/2022 2:31 PM  Performed by: Patience Musca., CRNAPre-anesthesia Checklist: Patient identified, Patient being monitored, Timeout performed, Emergency Drugs available and Suction available Patient Re-evaluated:Patient Re-evaluated prior to induction Oxygen Delivery Method: Circle system utilized Preoxygenation: Pre-oxygenation with 100% oxygen Induction Type: IV induction Ventilation: Mask ventilation without difficulty Laryngoscope Size: 3 and McGraph Grade View: Grade I Tube type: Oral Tube size: 6.5 mm Number of attempts: 1 Airway Equipment and Method: Stylet Placement Confirmation: ETT inserted through vocal cords under direct vision, positive ETCO2 and breath sounds checked- equal and bilateral Secured at: 20 cm Tube secured with: Tape Dental Injury: Teeth and Oropharynx as per pre-operative assessment

## 2022-12-12 NOTE — Anesthesia Preprocedure Evaluation (Addendum)
Anesthesia Evaluation  Patient identified by MRN, date of birth, ID band Patient awake    Reviewed: Allergy & Precautions, NPO status , Patient's Chart, lab work & pertinent test results  History of Anesthesia Complications Negative for: history of anesthetic complications  Airway Mallampati: III  TM Distance: >3 FB Neck ROM: full    Dental  (+) Partial Lower, Upper Dentures   Pulmonary former smoker   Pulmonary exam normal        Cardiovascular negative cardio ROS Normal cardiovascular exam     Neuro/Psych  PSYCHIATRIC DISORDERS Anxiety Depression    negative neurological ROS     GI/Hepatic negative GI ROS, Neg liver ROS,,,  Endo/Other  negative endocrine ROS    Renal/GU      Musculoskeletal   Abdominal   Peds  Hematology negative hematology ROS (+)   Anesthesia Other Findings Past Medical History: 2011: Constipation No date: Lump or mass in breast     Comment:  left 1993, 2002: Vaginal delivery  Past Surgical History: 2011: CESAREAN SECTION 04/25/2015: SCAR REVISION; N/A     Comment:  Procedure: Cesarean Section SCAR Mass/Nodule Excision;                Surgeon: Azucena Fallen, MD;  Location: [facility];  Service:               Gynecology;  Laterality: N/A;  1 hr. as child: TONSILLECTOMY  BMI    Body Mass Index: 24.90 kg/m      Reproductive/Obstetrics negative OB ROS                             Anesthesia Physical Anesthesia Plan  ASA: 1  Anesthesia Plan: General ETT   Post-op Pain Management: Ofirmev IV (intra-op)*, Toradol IV (intra-op)* and Dilaudid IV   Induction: Intravenous  PONV Risk Score and Plan: Ondansetron, Dexamethasone, Midazolam and Treatment may vary due to age or medical condition  Airway Management Planned: Oral ETT  Additional Equipment:   Intra-op Plan:   Post-operative Plan: Extubation in OR  Informed Consent: I have reviewed the patients  History and Physical, chart, labs and discussed the procedure including the risks, benefits and alternatives for the proposed anesthesia with the patient or authorized representative who has indicated his/her understanding and acceptance.     Dental Advisory Given  Plan Discussed with: Anesthesiologist, CRNA and Surgeon  Anesthesia Plan Comments: (Patient consented for risks of anesthesia including but not limited to:  - adverse reactions to medications - damage to eyes, teeth, lips or other oral mucosa - nerve damage due to positioning  - sore throat or hoarseness - Damage to heart, brain, nerves, lungs, other parts of body or loss of life  Patient voiced understanding.)        Anesthesia Quick Evaluation

## 2022-12-12 NOTE — ED Triage Notes (Signed)
Reports chest pain onset suddenly ~2230 with emesis onset 0000. Reports similar episode on Monday that resolved. Reports pain is a stabbing and pressure over central and R chest. Pt tremulous in triage. Alert and oriented following commands. Breathing unlabored speaking in full sentences.

## 2022-12-12 NOTE — Anesthesia Postprocedure Evaluation (Signed)
Anesthesia Post Note  Patient: MCKINZI COHOON  Procedure(s) Performed: XI ROBOTIC ASSISTED LAPAROSCOPIC CHOLECYSTECTOMY INDOCYANINE GREEN FLUORESCENCE IMAGING (ICG)  Patient location during evaluation: PACU Anesthesia Type: General Level of consciousness: awake and alert Pain management: pain level controlled Vital Signs Assessment: post-procedure vital signs reviewed and stable Respiratory status: spontaneous breathing, nonlabored ventilation, respiratory function stable and patient connected to nasal cannula oxygen Cardiovascular status: blood pressure returned to baseline and stable Postop Assessment: no apparent nausea or vomiting Anesthetic complications: no  There were no known notable events for this encounter.   Last Vitals:  Vitals:   12/12/22 1615 12/12/22 1643  BP: 90/63 (!) 100/42  Pulse: (!) 59 70  Resp: 12 16  Temp:  36.7 C  SpO2: 95% 97%    Last Pain:  Vitals:   12/12/22 1643  TempSrc: Oral  PainSc: [place]

## 2022-12-13 MED ORDER — HYDROCODONE-ACETAMINOPHEN 5-325 MG PO TABS: 1.0000 | ORAL_TABLET | ORAL | 0 refills | Status: AC | PRN

## 2022-12-13 NOTE — Plan of Care (Addendum)
Education and d/c instruction reviewed with pt. Pt denies any questions.   Problem: Education: Goal: Knowledge of General Education information will improve Description: Including pain rating scale, medication(s)/side effects and non-pharmacologic comfort measures 12/13/2022 1038 by Verl Dicker, RN Outcome: Adequate for Discharge 12/13/2022 0959 by Verl Dicker, RN Outcome: Progressing   Problem: Health Behavior/Discharge Planning: Goal: Ability to manage health-related needs will improve 12/13/2022 1038 by Verl Dicker, RN Outcome: Adequate for Discharge 12/13/2022 0959 by Verl Dicker, RN Outcome: Progressing   Problem: Clinical Measurements: Goal: Ability to maintain clinical measurements within normal limits will improve 12/13/2022 1038 by Verl Dicker, RN Outcome: Adequate for Discharge 12/13/2022 0959 by Verl Dicker, RN Outcome: Progressing Goal: Will remain free from infection 12/13/2022 1038 by Verl Dicker, RN Outcome: Adequate for Discharge 12/13/2022 0959 by Verl Dicker, RN Outcome: Progressing Goal: Diagnostic test results will improve 12/13/2022 1038 by Verl Dicker, RN Outcome: Adequate for Discharge 12/13/2022 0959 by Verl Dicker, RN Outcome: Progressing Goal: Respiratory complications will improve 12/13/2022 1038 by Verl Dicker, RN Outcome: Adequate for Discharge 12/13/2022 0959 by Verl Dicker, RN Outcome: Progressing Goal: Cardiovascular complication will be avoided 12/13/2022 1038 by Verl Dicker, RN Outcome: Adequate for Discharge 12/13/2022 0959 by Verl Dicker, RN Outcome: Progressing   Problem: Activity: Goal: Risk for activity intolerance will decrease 12/13/2022 1038 by Verl Dicker, RN Outcome: Adequate for Discharge 12/13/2022 0959 by Verl Dicker, RN Outcome: Progressing   Problem: Nutrition: Goal: Adequate nutrition will be maintained 12/13/2022 1038 by Verl Dicker, RN Outcome:  Adequate for Discharge 12/13/2022 0959 by Verl Dicker, RN Outcome: Progressing   Problem: Coping: Goal: Level of anxiety will decrease 12/13/2022 1038 by Verl Dicker, RN Outcome: Adequate for Discharge 12/13/2022 0959 by Verl Dicker, RN Outcome: Progressing   Problem: Elimination: Goal: Will not experience complications related to bowel motility 12/13/2022 1038 by Verl Dicker, RN Outcome: Adequate for Discharge 12/13/2022 0959 by Verl Dicker, RN Outcome: Progressing Goal: Will not experience complications related to urinary retention 12/13/2022 1038 by Verl Dicker, RN Outcome: Adequate for Discharge 12/13/2022 0959 by Verl Dicker, RN Outcome: Progressing   Problem: Pain Managment: Goal: General experience of comfort will improve 12/13/2022 1038 by Verl Dicker, RN Outcome: Adequate for Discharge 12/13/2022 0959 by Verl Dicker, RN Outcome: Progressing   Problem: Safety: Goal: Ability to remain free from injury will improve 12/13/2022 1038 by Verl Dicker, RN Outcome: Adequate for Discharge 12/13/2022 0959 by Verl Dicker, RN Outcome: Progressing   Problem: Skin Integrity: Goal: Risk for impaired skin integrity will decrease 12/13/2022 1038 by Verl Dicker, RN Outcome: Adequate for Discharge 12/13/2022 0959 by Verl Dicker, RN Outcome: Progressing

## 2022-12-13 NOTE — Plan of Care (Signed)

## 2022-12-13 NOTE — Discharge Instructions (Signed)

## 2022-12-13 NOTE — Discharge Summary (Signed)
  Patient ID: Crystal Cohen MRN: [id number] DOB/AGE: 13-Apr-1976 47 y.o.  Admit date: 12/12/2022 Discharge date: 12/13/2022   Discharge Diagnoses:  Principal Problem:   Acute cholecystitis   Procedures: Robotic assisted laparoscopic cholecystectomy  Hospital Course: Patient admitted with acute cholecystitis.  She underwent robotic cholecystectomy.  She has been recovering adequately.  The wounds are healing well.  Patient tolerating diet.  Pain controlled with current pain medication.  He has been no fever.  Physical Exam Vitals reviewed.  HENT:     Head: Normocephalic.  Cardiovascular:     Heart sounds: Normal heart sounds.  Pulmonary:     Effort: Pulmonary effort is normal.     Breath sounds: Normal breath sounds.  Abdominal:     General: Bowel sounds are normal.     Palpations: Abdomen is soft.     Tenderness: There is no abdominal tenderness.  Skin:    General: Skin is warm.  Neurological:     Mental Status: She is alert and oriented to person, place, and time.      Consults: None  Disposition: Discharge disposition: 01-Home or Self Care       Discharge Instructions     Diet - low sodium heart healthy   Complete by: As directed    Increase activity slowly   Complete by: As directed       Allergies as of 12/13/2022   No Known Allergies      Medication List     TAKE these medications    HYDROcodone-acetaminophen 5-325 MG tablet Commonly known as: Norco Take 1 tablet by mouth every 4 (four) hours as needed for up to 3 days for moderate pain.        Follow-up Information     Herbert Pun, MD Follow up in 2 week(s).   Specialty: General Surgery Contact information: [address] [place] [address] [phone]

## 2022-12-16 LAB — SURGICAL PATHOLOGY
